# Patient Record
Sex: Female | Born: 1980 | Race: Black or African American | Hispanic: No | Marital: Single | Smoking: Never smoker
Health system: Southern US, Community
[De-identification: ages and names within clinical notes are randomized; demographics above are authoritative.]

## PROBLEM LIST (undated history)

## (undated) DIAGNOSIS — F449 Dissociative and conversion disorder, unspecified: Secondary | ICD-10-CM

## (undated) DIAGNOSIS — K219 Gastro-esophageal reflux disease without esophagitis: Secondary | ICD-10-CM

## (undated) DIAGNOSIS — E669 Obesity, unspecified: Secondary | ICD-10-CM

## (undated) DIAGNOSIS — E119 Type 2 diabetes mellitus without complications: Secondary | ICD-10-CM

## (undated) HISTORY — PX: COLONOSCOPY: SHX174

## (undated) HISTORY — DX: Type 2 diabetes mellitus without complications: E11.9

## (undated) HISTORY — DX: Obesity, unspecified: E66.9

## (undated) HISTORY — PX: OTHER SURGICAL HISTORY: SHX169

## (undated) HISTORY — DX: Gastro-esophageal reflux disease without esophagitis: K21.9

## (undated) HISTORY — PX: WISDOM TOOTH EXTRACTION: SHX21

## (undated) HISTORY — DX: Dissociative and conversion disorder, unspecified: F44.9

## (undated) HISTORY — PX: HERNIA REPAIR: SHX51

---

## 1998-05-11 ENCOUNTER — Emergency Department (HOSPITAL_COMMUNITY): Admission: EM | Admit: 1998-05-11 | Discharge: 1998-05-11 | Payer: Self-pay | Admitting: Emergency Medicine

## 1999-02-07 ENCOUNTER — Emergency Department (HOSPITAL_COMMUNITY): Admission: EM | Admit: 1999-02-07 | Discharge: 1999-02-07 | Payer: Self-pay | Admitting: Emergency Medicine

## 2000-03-25 ENCOUNTER — Emergency Department (HOSPITAL_COMMUNITY): Admission: EM | Admit: 2000-03-25 | Discharge: 2000-03-25 | Payer: Self-pay | Admitting: Emergency Medicine

## 2000-03-25 ENCOUNTER — Encounter: Payer: Self-pay | Admitting: Emergency Medicine

## 2005-06-13 ENCOUNTER — Emergency Department (HOSPITAL_COMMUNITY): Admission: EM | Admit: 2005-06-13 | Discharge: 2005-06-14 | Payer: Self-pay | Admitting: Emergency Medicine

## 2006-01-29 ENCOUNTER — Other Ambulatory Visit: Admission: RE | Admit: 2006-01-29 | Discharge: 2006-01-29 | Payer: Self-pay | Admitting: Family Medicine

## 2007-02-13 ENCOUNTER — Other Ambulatory Visit: Admission: RE | Admit: 2007-02-13 | Discharge: 2007-02-13 | Payer: Self-pay | Admitting: Family Medicine

## 2008-05-13 ENCOUNTER — Other Ambulatory Visit: Admission: RE | Admit: 2008-05-13 | Discharge: 2008-05-13 | Payer: Self-pay | Admitting: Family Medicine

## 2008-05-18 ENCOUNTER — Encounter: Admission: RE | Admit: 2008-05-18 | Discharge: 2008-05-18 | Payer: Self-pay | Admitting: Family Medicine

## 2009-06-01 ENCOUNTER — Other Ambulatory Visit: Admission: RE | Admit: 2009-06-01 | Discharge: 2009-06-01 | Payer: Self-pay | Admitting: Family Medicine

## 2010-04-10 ENCOUNTER — Encounter: Payer: Self-pay | Admitting: Family Medicine

## 2010-07-21 ENCOUNTER — Other Ambulatory Visit (HOSPITAL_COMMUNITY)
Admission: RE | Admit: 2010-07-21 | Discharge: 2010-07-21 | Disposition: A | Discharge status: Home / Self Care | Payer: BC Managed Care – PPO | Source: Ambulatory Visit | Attending: Family Medicine | Admitting: Family Medicine

## 2010-07-21 ENCOUNTER — Encounter: Payer: Self-pay | Admitting: Family Medicine

## 2010-07-21 ENCOUNTER — Ambulatory Visit (INDEPENDENT_AMBULATORY_CARE_PROVIDER_SITE_OTHER): Payer: BC Managed Care – PPO | Admitting: Family Medicine

## 2010-07-21 DIAGNOSIS — Z1159 Encounter for screening for other viral diseases: Secondary | ICD-10-CM | POA: Insufficient documentation

## 2010-07-21 DIAGNOSIS — Z0000 Encounter for general adult medical examination without abnormal findings: Secondary | ICD-10-CM

## 2010-07-21 DIAGNOSIS — E282 Polycystic ovarian syndrome: Secondary | ICD-10-CM | POA: Insufficient documentation

## 2010-07-21 DIAGNOSIS — Z01419 Encounter for gynecological examination (general) (routine) without abnormal findings: Secondary | ICD-10-CM | POA: Insufficient documentation

## 2010-07-21 DIAGNOSIS — Z136 Encounter for screening for cardiovascular disorders: Secondary | ICD-10-CM

## 2010-07-21 NOTE — Patient Instructions (Signed)
Great to meet you! 

## 2010-07-21 NOTE — Progress Notes (Signed)
30 yo here to establish care and for CPX.  G0P0, not sexually active.  Takes Metformin 1000 mg daily for ?PCOS. Per pt, not a diabetic.  Has had issues with her weight her entire life.  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 Lives with her mom who has a personality disorder and severe anxiety so at times, she gets overwhelmed. She and her mother often go to psychotherapy together which [place] has found to be helpful.    The PMH, PSH, Social History, Family History, Medications, and allergies have been reviewed in [facility], and have been updated if relevant.  ROS: General: Denies fever, chills, sweats. No significant weight loss. Eyes: Denies blurring,significant itching ENT: Denies earache, sore throat, and hoarseness.  Cardiovascular: Denies chest pains, palpitations, dyspnea on exertion,  Respiratory: Denies cough, dyspnea at rest,wheeezing Breast: no concerns about lumps GI: Denies nausea, vomiting, diarrhea, constipation, change in bowel habits, abdominal pain, melena, hematochezia GU: Denies dysuria, hematuria, urinary hesitancy, nocturia, denies STD risk, no concerns about discharge Musculoskeletal: Denies back pain, joint pain Derm: Denies rash, itching Neuro: Denies  paresthesias, frequent falls, frequent headaches Psych: Denies depression, anxiety Endocrine: Denies cold intolerance, heat intolerance, polydipsia Heme: Denies enlarged lymph nodes Allergy: No hayfever  Physical exam: BP 120/82  Pulse 80  Temp(Src) 99.3 F (37.4 C) (Oral)  Ht 5\' 5"  (1.651 m)  Wt 233 lb (105.688 kg)  BMI 38.77 kg/m2  LMP 06/19/2010  General:  Well-developed, overweight appearing,in no acute distress; alert,appropriate and cooperative throughout examination Head:  normocephalic and atraumatic.   Eyes:  vision grossly intact, pupils equal, pupils round, and pupils reactive to light.   Ears:  R ear normal and L ear normal.   Nose:  no external deformity.   Mouth:  good dentition.   Neck:  No  deformities, masses, or tenderness noted. Breasts:  No mass, nodules, thickening, tenderness, bulging, retraction, inflamation, nipple discharge or skin changes noted.   Lungs:  Normal respiratory effort, chest expands symmetrically. Lungs are clear to auscultation, no crackles or wheezes. Heart:  Normal rate and regular rhythm. S1 and S2 normal without gallop, murmur, click, rub or other extra sounds. Abdomen:  Bowel sounds positive,abdomen soft and non-tender without masses, organomegaly or hernias noted. Rectal:  no external abnormalities.   Genitalia:  Pelvic Exam:        External: normal female genitalia without lesions or masses        Vagina: normal without lesions or masses        Cervix: normal without lesions or masses        Adnexa: normal bimanual exam without masses or fullness        Uterus: normal by palpation        Pap smear: performed Msk:  No deformity or scoliosis noted of thoracic or lumbar spine.   Extremities:  No clubbing, cyanosis, edema, or deformity noted with normal full range of motion of all joints.   Neurologic:  alert & oriented X3 and gait normal.   Skin:  Intact without suspicious lesions or rashes Cervical Nodes:  No lymphadenopathy noted Axillary Nodes:  No palpable lymphadenopathy Psych:  Cognition and judgment appear intact. Alert and cooperative with normal attention span and concentration. No apparent delusions, illusions, hallucinations

## 2010-07-21 NOTE — Assessment & Plan Note (Signed)
Reviewed preventive care protocols, scheduled due services, and updated immunizations Discussed nutrition, exercise, diet, and healthy lifestyle.  Orders Placed This Encounter  Procedures  . HgB A1c  . Basic Metabolic Panel (BMET)  . Lipid panel

## 2010-07-21 NOTE — Assessment & Plan Note (Signed)
Recheck a1c today.  Awaiting records.

## 2010-07-22 LAB — BASIC METABOLIC PANEL
BUN: 8 mg/dL (ref 6–23)
CO2: 26 mEq/L (ref 19–32)
Calcium: 9 mg/dL (ref 8.4–10.5)
Chloride: 106 mEq/L (ref 96–112)
Creatinine, Ser: 0.7 mg/dL (ref 0.4–1.2)
GFR: 133.68 mL/min (ref >60.00)
Glucose, Bld: 92 mg/dL (ref 70–99)
Potassium: 3.9 mEq/L (ref 3.5–5.1)
Sodium: 139 mEq/L (ref 135–145)

## 2010-07-22 LAB — LIPID PANEL
Cholesterol: 116 mg/dL (ref 0–200)
HDL: 43.4 mg/dL (ref >39.00)
LDL Cholesterol: 62 mg/dL (ref 0–99)
Total CHOL/HDL Ratio: 3
Triglycerides: 53 mg/dL (ref 0.0–149.0)
VLDL: 10.6 mg/dL (ref 0.0–40.0)

## 2010-07-22 LAB — HEMOGLOBIN A1C: Hgb A1c MFr Bld: 6.7 % — ABNORMAL HIGH (ref 4.6–6.5)

## 2010-07-26 ENCOUNTER — Encounter: Payer: Self-pay | Admitting: *Deleted

## 2010-08-03 ENCOUNTER — Encounter: Payer: Self-pay | Admitting: Family Medicine

## 2010-08-09 ENCOUNTER — Encounter: Payer: Self-pay | Admitting: Family Medicine

## 2010-09-12 ENCOUNTER — Telehealth: Payer: Self-pay | Admitting: *Deleted

## 2010-09-12 MED ORDER — GLYBURIDE 2.5 MG PO TABS: 2.5000 mg | ORAL_TABLET | Freq: Every day | ORAL | Status: DC

## 2010-09-12 NOTE — Telephone Encounter (Signed)
Message left on cell phone voicemail advising patient as instructed. 

## 2010-09-12 NOTE — Telephone Encounter (Signed)
Patient say that she really doesn't like the metformin and that it makes her really nauseated. She is asking if she can try something else. Uses [facility].

## 2010-09-12 NOTE — Telephone Encounter (Signed)
Ok, let's try Glyburide. Rx sent to her pharmacy.

## 2010-09-15 ENCOUNTER — Ambulatory Visit (INDEPENDENT_AMBULATORY_CARE_PROVIDER_SITE_OTHER): Payer: BC Managed Care – PPO | Admitting: Family Medicine

## 2010-09-15 ENCOUNTER — Encounter: Payer: Self-pay | Admitting: Family Medicine

## 2010-09-15 VITALS — BP 110/70 | HR 102 | Temp 99.0°F | Ht 65.0 in | Wt 235.0 lb

## 2010-09-15 DIAGNOSIS — Z309 Encounter for contraceptive management, unspecified: Secondary | ICD-10-CM | POA: Insufficient documentation

## 2010-09-15 DIAGNOSIS — E669 Obesity, unspecified: Secondary | ICD-10-CM

## 2010-09-15 DIAGNOSIS — R000 Tachycardia, unspecified: Secondary | ICD-10-CM

## 2010-09-15 MED ORDER — ETONOGESTREL-ETHINYL ESTRADIOL 0.12-0.015 MG/24HR VA RING: 1.0000 | VAGINAL_RING | VAGINAL | Status: DC

## 2010-09-15 NOTE — Patient Instructions (Signed)
Great to see you. Please stop by to see Aram Beecham on your way out.

## 2010-09-15 NOTE — Progress Notes (Signed)
30 yo here to discuss birth control.    G0P0, currently sexually active with ex boyfriend. Gained weight with depo provera.  Was on Nuvaring in past and liked it. No personal or family h/o blood clots.  Pt is a non smoker.    Obesity- Has had issues with her weight her entire life. BMI is currently 39. Has tried several diets including weight watchers, low carb, even diet pills. Admits to not having self control and craves sugary drinks, like sweet tea.  Tachycardia- pulse is 102-106 on exam today. Rushed to get here, a little nervous. No CP, SOB, dizziness, or LE edema. Pulse was 80 at last OV last month.   The PMH, PSH, Social History, Family History, Medications, and allergies have been reviewed in [facility], and have been updated if relevant.  ROS: General: Denies fever, chills, sweats. No significant weight loss. Eyes: Denies blurring,significant itching ENT: Denies earache, sore throat, and hoarseness.  Cardiovascular: Denies chest pains, palpitations, dyspnea on exertion,  Respiratory: Denies cough, dyspnea at rest,wheeezing Breast: no concerns about lumps GI: Denies nausea, vomiting, diarrhea, constipation, change in bowel habits, abdominal pain, melena, hematochezia GU: Denies dysuria, hematuria, urinary hesitancy, nocturia, denies STD risk, no concerns about discharge Musculoskeletal: Denies back pain, joint pain Derm: Denies rash, itching Neuro: Denies  paresthesias, frequent falls, frequent headaches Psych: Denies depression, anxiety Endocrine: Denies cold intolerance, heat intolerance, polydipsia Heme: Denies enlarged lymph nodes Allergy: No hayfever  Physical exam: BP 110/70  Pulse 102  Temp(Src) 99 F (37.2 C) (Oral)  Ht 5\' 5"  (1.651 m)  Wt 235 lb (106.595 kg)  BMI 39.11 kg/m2  LMP 08/19/2010  General:  Well-developed, overweight appearing,in no acute distress; alert,appropriate and cooperative throughout examination Head:  normocephalic and atraumatic.     Eyes:  vision grossly intact, pupils equal, pupils round, and pupils reactive to light.   Ears:  R ear normal and L ear normal.   Nose:  no external deformity.   Mouth:  good dentition.   Neck:  No deformities, masses, or tenderness noted. Lungs:  Normal respiratory effort, chest expands symmetrically. Lungs are clear to auscultation, no crackles or wheezes. Heart:tachycardia, and regular rhythm. S1 and S2 normal without gallop, murmur, click, rub or other extra sounds. Abdomen:  Bowel sounds positive,abdomen soft and non-tender without masses, organomegaly or hernias noted. Msk:  No deformity or scoliosis noted of thoracic or lumbar spine.   Extremities:  No clubbing, cyanosis, edema, or deformity noted with normal full range of motion of all joints.   Neurologic:  alert & oriented X3 and gait normal.   Skin:  Intact without suspicious lesions or rashes Cervical Nodes:  No lymphadenopathy noted Axillary Nodes:  No palpable lymphadenopathy Psych:  Cognition and judgment appear intact. Alert and cooperative with normal attention span and concentration. No apparent delusions, illusions, hallucinations  Assessment and Plan: 1. Obesity  Deteriorated. .>35 min spent with face to face with patient, >50% counseling and/or coordinating care. Pt would like to consider bariatric surgery. Referral placed.    2. Tachycardia  New.  EKG likely normal variant with rate of 86. Was likely due to nervousness and racing to get to appt on time. Reassurance provided.  If she develops any symptoms, like palpitations, CP or SOB she will let me know.  3. Contraception management  After discussion of different options, pt would like to retry Nuvaring again. Rx given to her in office today.

## 2011-05-29 ENCOUNTER — Ambulatory Visit: Payer: BC Managed Care – PPO | Admitting: Family Medicine

## 2011-05-31 ENCOUNTER — Encounter: Payer: Self-pay | Admitting: Family Medicine

## 2011-05-31 ENCOUNTER — Ambulatory Visit (INDEPENDENT_AMBULATORY_CARE_PROVIDER_SITE_OTHER): Payer: BC Managed Care – PPO | Admitting: Family Medicine

## 2011-05-31 ENCOUNTER — Encounter: Payer: Self-pay | Admitting: Gastroenterology

## 2011-05-31 VITALS — BP 140/90 | HR 64 | Temp 98.7°F | Wt 249.0 lb

## 2011-05-31 DIAGNOSIS — R1310 Dysphagia, unspecified: Secondary | ICD-10-CM | POA: Insufficient documentation

## 2011-05-31 DIAGNOSIS — K921 Melena: Secondary | ICD-10-CM

## 2011-05-31 DIAGNOSIS — R110 Nausea: Secondary | ICD-10-CM | POA: Insufficient documentation

## 2011-05-31 DIAGNOSIS — R195 Other fecal abnormalities: Secondary | ICD-10-CM

## 2011-05-31 LAB — CBC WITH DIFFERENTIAL/PLATELET
Basophils Absolute: 0 10*3/uL (ref 0.0–0.1)
Eosinophils Absolute: 0 10*3/uL (ref 0.0–0.7)
HCT: 37.1 % (ref 36.0–46.0)
Lymphs Abs: 2.2 10*3/uL (ref 0.7–4.0)
MCHC: 32.9 g/dL (ref 30.0–36.0)
MCV: 86.5 fl (ref 78.0–100.0)
Monocytes Absolute: 0.3 10*3/uL (ref 0.1–1.0)
Platelets: 293 10*3/uL (ref 150.0–400.0)
RDW: 14.1 % (ref 11.5–14.6)

## 2011-05-31 LAB — COMPREHENSIVE METABOLIC PANEL
ALT: 25 U/L (ref 0–35)
Alkaline Phosphatase: 48 U/L (ref 39–117)
Glucose, Bld: 164 mg/dL — ABNORMAL HIGH (ref 70–99)
Sodium: 139 mEq/L (ref 135–145)
Total Bilirubin: 0 mg/dL — ABNORMAL LOW (ref 0.3–1.2)
Total Protein: 7.3 g/dL (ref 6.0–8.3)

## 2011-05-31 MED ORDER — ESOMEPRAZOLE MAGNESIUM 40 MG PO CPDR: 40.0000 mg | DELAYED_RELEASE_CAPSULE | Freq: Every day | ORAL | Status: DC

## 2011-05-31 NOTE — Progress Notes (Signed)
Subjective:    Patient ID: Wanda Mercer, female    DOB: 1980-05-28, 31 y.o.   MRN: [id number]  HPI  31 yo here for progressive symptoms of "feeling bad."  Noticed a couple of months ago that solid foods often "get stuck." At times she has to make herself vomit to feel better. No nausea.   She does endorse one episode of "black stools" and not sure if she had taken pepto or other meds prior.  Stools since have been normal. No abdominal pain.  Sensation occurs usually after eating.  Does have a h/o GERD but prilosec not helping with symptoms.  Pain not worse with certain foods.  Patient Active Problem List  Diagnoses  . PCOS (polycystic ovarian syndrome)  . Routine general medical examination at a health care facility  . Obesity  . Tachycardia  . Contraception management  . Difficulty swallowing solids  . Nausea  . Black stools   Past Medical History  Diagnosis Date  . PCOS (polycystic ovarian syndrome)    No past surgical history on file. History  Substance Use Topics  . Smoking status: Never Smoker   . Smokeless tobacco: Not on file  . Alcohol Use: Not on file   Family History  Problem Relation Age of Onset  . Depression Mother   . Mental illness Mother    Allergies  Allergen Reactions  . Penicillins Other (See Comments)    Stomach pain   Current Outpatient Prescriptions on File Prior to Visit  Medication Sig Dispense Refill  . etonogestrel-ethinyl estradiol (NUVARING) 0.12-0.015 MG/24HR vaginal ring Place 1 each vaginally every 21 ( twenty-one) days. Insert one (1) ring vaginally and leave in place for three (3) weeks, then remove for one (1) week.  1 each  11  . glyBURIDE (DIABETA) 2.5 MG tablet Take 1 tablet (2.5 mg total) by mouth daily with breakfast.  30 tablet  1  . omeprazole (PRILOSEC) 20 MG capsule Take 20 mg by mouth daily.           Review of Systems See HPI   No fevers or abdominal pain Objective:   Physical Exam BP 140/100  Pulse 64   Temp(Src) 98.7 F (37.1 C) (Oral)  Wt 249 lb (112.946 kg)  General:  Well-developed,well-nourished,in no acute distress; alert,appropriate and cooperative throughout examination Head:  normocephalic and atraumatic.   Eyes:  vision grossly intact, pupils equal, pupils round, and pupils reactive to light.   Ears:  R ear normal and L ear normal.   Nose:  no external deformity.   Mouth:  good dentition.   Neck:  No deformities, masses, or tenderness noted. Abdomen:  Bowel sounds positive,abdomen soft and non-tender without masses, organomegaly or hernias noted. Neurologic:  alert & oriented X3 and gait normal.   Skin:  Intact without suspicious lesions or rashes Psych:  Cognition and judgment appear intact. Alert and cooperative with normal attention span and concentration. No apparent delusions, illusions, hallucinations        Assessment & Plan:   1. Difficulty swallowing solids  New and progressing. ?esophageal stricture- will refer to GI for endoscopy/further work up. Differential also includes PUD and biliary disease although less likely given history. Ambulatory referral to Gastroenterology  2. Black stools  New and resolved. Most likely secondary to medications she took prior.  Will check CBC, CMET today and place on 2 weeks of daily nexium (samples given). The patient indicates understanding of these issues and agrees with the plan.  CBC with  Differential, Comprehensive metabolic panel [[facility]], Ambulatory referral to Gastroenterology

## 2011-05-31 NOTE — Patient Instructions (Signed)
Good to see you. Please stop by to see Wanda Mercer on your way out. Take one nexium tablet every day for next two weeks. I will call you with your lab results.

## 2011-06-04 ENCOUNTER — Other Ambulatory Visit: Payer: Self-pay | Admitting: Family Medicine

## 2011-06-06 ENCOUNTER — Encounter: Payer: Self-pay | Admitting: Gastroenterology

## 2011-06-06 ENCOUNTER — Ambulatory Visit (INDEPENDENT_AMBULATORY_CARE_PROVIDER_SITE_OTHER): Payer: BC Managed Care – PPO | Admitting: Gastroenterology

## 2011-06-06 DIAGNOSIS — R110 Nausea: Secondary | ICD-10-CM

## 2011-06-06 DIAGNOSIS — K219 Gastro-esophageal reflux disease without esophagitis: Secondary | ICD-10-CM

## 2011-06-06 DIAGNOSIS — R109 Unspecified abdominal pain: Secondary | ICD-10-CM

## 2011-06-06 DIAGNOSIS — R195 Other fecal abnormalities: Secondary | ICD-10-CM

## 2011-06-06 DIAGNOSIS — R1310 Dysphagia, unspecified: Secondary | ICD-10-CM

## 2011-06-06 DIAGNOSIS — K921 Melena: Secondary | ICD-10-CM

## 2011-06-06 MED ORDER — DEXLANSOPRAZOLE 60 MG PO CPDR: 60.0000 mg | DELAYED_RELEASE_CAPSULE | Freq: Every day | ORAL | Status: AC

## 2011-06-06 NOTE — Progress Notes (Signed)
History of Present Illness:  This is a elderly obese 31 year old African American female who presents with years of rather typical as the reflux symptom with burning substernal chest pain daytime and nighttime. For the last 2 months she's had intermittent solid food dysphagia. She had a negative upper GI series one year ago. She denies a specific hepatobiliary complaints, but does complain of abdominal gas, bloating, excessive flatus. She does not have known lactose intolerance. She denies bowel or regularity, melena, or hematochezia. Her mother apparently has similar reflux problems. Lab review shows normal CBC and normal liver enzymes except for slightly elevated alkaline phosphatase, and a hemoglobin A1c of 6.7.  I have reviewed this patient's present history, medical and surgical past history, allergies and medications.     ROS: The remainder of the 10 point ROS is negative... symptoms of Raynaud's phenomenon for collagen vascular disease. She has no history of previous hepatitis or pancreatitis, abuse of alcohol, cigarettes, or NSAIDs.     Physical Exam: Today. Patient in no acute distress. Blood pressure 142/86, pulse 100 and regular and BMI of 41.77. General well developed well nourished patient in no acute distress, appearing her stated age Eyes PERRLA, no icterus, fundoscopic exam per opthamologist Skin no lesions noted Neck supple, no adenopathy, no thyroid enlargement, no tenderness Chest clear to percussion and auscultation Heart no significant murmurs, gallops or rubs noted Abdomen no hepatosplenomegaly masses or tenderness, BS normal.  Extremities no acute joint lesions, edema, phlebitis or evidence of cellulitis. Neurologic patient oriented x 3, cranial nerves intact, no focal neurologic deficits noted. Psychological mental status normal and normal affect.  Assessment and plan: chroni GERD with possible peptic stricture of the esophagus. Reflux regime reviewed with the patient  and she was shown an education video on GERD. Her scheduled endoscopy with possible dilatation at her convenience, and I placed her on regular Dexilant 60 mg 30 minutes before first meal the day. This patient should possibly consider bariatric surgery. Have also schedule upper GI ultrasound exam to exclude cholelithiasis or fatty liver problems.  No diagnosis found.

## 2011-06-06 NOTE — Patient Instructions (Signed)
You have been scheduled for an abdominal ultrasound.  [facility] on 06/08/11 please arrive at 10:45 am and have nothing to eat or drink after 5 am. You have been scheduled for an Endoscopy, instructions provided. Dexilant samples have been given to you, take 1 daily 20-30 minutes before breakfast. You have seen the reflux movie.

## 2011-06-08 ENCOUNTER — Other Ambulatory Visit (HOSPITAL_COMMUNITY): Payer: BC Managed Care – PPO

## 2011-06-14 ENCOUNTER — Ambulatory Visit (AMBULATORY_SURGERY_CENTER): Payer: BC Managed Care – PPO | Admitting: Gastroenterology

## 2011-06-14 ENCOUNTER — Encounter (HOSPITAL_COMMUNITY): Payer: Self-pay | Admitting: Emergency Medicine

## 2011-06-14 ENCOUNTER — Emergency Department (HOSPITAL_COMMUNITY)
Admission: EM | Admit: 2011-06-14 | Discharge: 2011-06-15 | Disposition: A | Discharge status: Home / Self Care | Payer: BC Managed Care – PPO | Attending: Emergency Medicine | Admitting: Emergency Medicine

## 2011-06-14 ENCOUNTER — Encounter: Payer: Self-pay | Admitting: Gastroenterology

## 2011-06-14 ENCOUNTER — Emergency Department (HOSPITAL_COMMUNITY): Payer: BC Managed Care – PPO

## 2011-06-14 ENCOUNTER — Telehealth: Payer: Self-pay | Admitting: Gastroenterology

## 2011-06-14 VITALS — BP 145/93 | HR 105 | Temp 98.6°F | Resp 16 | Ht 65.0 in | Wt 251.0 lb

## 2011-06-14 DIAGNOSIS — R51 Headache: Secondary | ICD-10-CM | POA: Insufficient documentation

## 2011-06-14 DIAGNOSIS — R1310 Dysphagia, unspecified: Secondary | ICD-10-CM

## 2011-06-14 DIAGNOSIS — R4182 Altered mental status, unspecified: Secondary | ICD-10-CM | POA: Insufficient documentation

## 2011-06-14 DIAGNOSIS — T7840XA Allergy, unspecified, initial encounter: Secondary | ICD-10-CM | POA: Insufficient documentation

## 2011-06-14 DIAGNOSIS — X58XXXA Exposure to other specified factors, initial encounter: Secondary | ICD-10-CM | POA: Insufficient documentation

## 2011-06-14 DIAGNOSIS — K219 Gastro-esophageal reflux disease without esophagitis: Secondary | ICD-10-CM

## 2011-06-14 MED ORDER — SODIUM CHLORIDE 0.9 % IV SOLN: 500.0000 mL | INTRAVENOUS | Status: DC

## 2011-06-14 MED ORDER — LORAZEPAM 2 MG/ML IJ SOLN
1.0000 mg | Freq: Once | INTRAMUSCULAR | Status: AC
  Administered 2011-06-15: 1 mg via INTRAVENOUS
  Filled 2011-06-14: qty 1

## 2011-06-14 MED ORDER — LORAZEPAM 1 MG PO TABS
2.0000 mg | ORAL_TABLET | Freq: Once | ORAL | Status: AC
  Administered 2011-06-14: 2 mg via ORAL
  Filled 2011-06-14: qty 1

## 2011-06-14 NOTE — ED Provider Notes (Signed)
History     CSN: [id number]  Arrival date & time 06/14/11  7829   First MD Initiated Contact with Patient 06/14/11 1953      Chief Complaint  Patient presents with  . Allergic Reaction     HPI Pt presenting to ed with c/o just left [place] for colonoscopy and received propofol and per mother on the way home pt attempted to jump out of car and pt is not acting her normal self. Per mother pt is not pt is talking like a baby and not sounding like herself. Pt denies si/hi. Pt is alert and oriented at this time. Pt tearful stating what did they do to me.  Past Medical History  Diagnosis Date  . GERD (gastroesophageal reflux disease)   . Obesity     Past Surgical History  Procedure Date  . Oral surgeries   . Hernia repair   . Colonoscopy     Family History  Problem Relation Age of Onset  . Ovarian cancer Maternal Aunt   . Diabetes Maternal Grandmother   . Diabetes Father   . Heart failure Father   . Emphysema Paternal Grandmother   . Diabetes Paternal Grandmother     History  Substance Use Topics  . Smoking status: Never Smoker   . Smokeless tobacco: Never Used  . Alcohol Use: Yes     occasionally    OB History    Grav Para Term Preterm Abortions TAB SAB Ect Mult Living                  Review of Systems  Unable to perform ROS: Mental status change    Allergies  Penicillins and Propofol  Home Medications   Current Outpatient Rx  Name Route Sig Dispense Refill  . DEXLANSOPRAZOLE 60 MG PO CPDR Oral Take 1 capsule (60 mg total) by mouth daily. 15 capsule 0  . ETONOGESTREL-ETHINYL ESTRADIOL 0.12-0.015 MG/24HR VA RING Vaginal Place 1 each vaginally every 21 ( twenty-one) days. Insert one (1) ring vaginally and leave in place for three (3) weeks, then remove for one (1) week.      BP 119/50  Pulse 86  Temp(Src) 99.1 F (37.3 C) (Oral)  Resp 18  Ht 5\' 5"  (1.651 m)  Wt 250 lb (113.399 kg)  BMI 41.60 kg/m2  SpO2 98%  LMP 05/08/2011  Physical Exam    Nursing note and vitals reviewed. Constitutional: She is oriented to person, place, and time. She appears well-developed and well-nourished. No distress.  HENT:  Head: Normocephalic and atraumatic.  Eyes: Pupils are equal, round, and reactive to light.  Neck: Normal range of motion.  Cardiovascular: Intact distal pulses.  Tachycardia present.   Pulmonary/Chest: No respiratory distress.  Abdominal: Normal appearance. She exhibits no distension.  Musculoskeletal: Normal range of motion.  Neurological: She is alert and oriented to person, place, and time. No cranial nerve deficit.  Skin: Skin is warm and dry. No rash noted.  Psychiatric: Her mood appears anxious. Her speech is rapid and/or pressured. She is agitated and actively hallucinating. Thought content is paranoid. Cognition and memory are impaired. She does not express inappropriate judgment. She expresses no suicidal plans and no homicidal plans.    ED Course  Procedures (including critical care time)  Labs Reviewed  CBC - Abnormal; Notable for the following:    Hemoglobin 11.6 (*)    HCT 35.4 (*)    All other components within normal limits  BASIC METABOLIC PANEL - Abnormal; Notable for  the following:    CO2 18 (*)    Glucose, Bld 122 (*)    All other components within normal limits  SALICYLATE LEVEL - Abnormal; Notable for the following:    Salicylate Lvl <2.0 (*)    All other components within normal limits  DIFFERENTIAL  URINE RAPID DRUG SCREEN (HOSP PERFORMED)  ETHANOL  ACETAMINOPHEN LEVEL  POCT PREGNANCY, URINE  LAB REPORT - SCANNED   No results found.   1. Allergic reaction       MDM  Patient was reevaluated after approximately 3 hours and still seems inappropriate talking like a young child very labile behavior but no focal neurologic findings plan at this time status today her do a CT scan and reevaluate at that time.       Nelia Shi, MD 06/18/11 [phone]

## 2011-06-14 NOTE — ED Notes (Signed)
Pt is very confused and is talking baby talk.  She would not take the ativan and it took coxing and placing it in applesauce and she swolled it she was offered some food but would not eat.

## 2011-06-14 NOTE — Op Note (Signed)
[facility] [address] [place], [place]  [address]  ENDOSCOPY PROCEDURE REPORT  PATIENT:  Wanda Mercer, Wanda Mercer  MR#:  [id number] BIRTHDATE:  21-Feb-1981, 30 yrs. old  GENDER:  female  ENDOSCOPIST:  Vania Rea. Jarold Motto, MD, [facility], [place] Referred by:  PROCEDURE DATE:  06/14/2011 PROCEDURE:  Elease Hashimoto Dilation of Esophagus, EGD, diagnostic 43235 ASA CLASS:  Class III INDICATIONS:  chest pain, dysphagia NORMAL BARIUM SWALLOW0  MEDICATIONS:   propofol (Diprivan) 200 mg IV TOPICAL ANESTHETIC:  DESCRIPTION OF PROCEDURE:   After the risks and benefits of the procedure were explained, informed consent was obtained.  The LB GIF-H180 [id number] endoscope was introduced through the mouth and advanced to the second portion of the duodenum.  The instrument was slowly withdrawn as the mucosa was fully examined. <<PROCEDUREIMAGES>>  A hiatal hernia was found. 3 CM HH NOTED.SEE PICTURES.  Normal GE junction was noted.  Normal duodenal folds were noted.  The stomach was entered and closely examined. The antrum, angularis, and lesser curvature were well visualized, including a retroflexed view of the cardia and fundus. The stomach wall was normally distensable. The scope passed easily through the pylorus into the duodenum.  The esophagus and gastroesophageal junction were completely normal in appearance. DILATED #73F MALONEY DILATOR.NO HEME OR PAIN.    Retroflexed views revealed a hiatal hernia. The scope was then withdrawn from the patient and the procedure completed.  COMPLICATIONS:  None  ENDOSCOPIC IMPRESSION: 1) Hiatal hernia 2) Normal GE junction 3) Normal duodenal folds 4) Normal stomach 5) Normal esophagus 6) A hiatal hernia PROBABLE GERD AND OCCULT STRICTURE DILATED RECOMMENDATIONS: 1) Anti-reflux regimen to be follow CONTINUE PPI RX.ULTRASOUND PENDING.  ______________________________ Vania Rea. Jarold Motto, MD, [facility]  CC:  Ruthe Mannan MD  n. Rosalie DoctorVania Rea. Ishaq Maffei at  06/14/2011 04:40 PM  Wendall Mola, [phone]

## 2011-06-14 NOTE — Telephone Encounter (Signed)
Answering service message stating that mother called b/c daughter, who had Egd earlier in day, was acting funny.  Called twice - no answer.  Left message on the second call.

## 2011-06-14 NOTE — Patient Instructions (Signed)
Discharge instructions given with verbal understanding. Handout on a dilatation diet given. Resume previous medications. YOU HAD AN ENDOSCOPIC PROCEDURE TODAY AT THE [facility]: Refer to the procedure report that was given to you for any specific questions about what was found during the examination.  If the procedure report does not answer your questions, please call your gastroenterologist to clarify.  If you requested that your care partner not be given the details of your procedure findings, then the procedure report has been included in a sealed envelope for you to review at your convenience later.  YOU SHOULD EXPECT: Some feelings of bloating in the abdomen. Passage of more gas than usual.  Walking can help get rid of the air that was put into your GI tract during the procedure and reduce the bloating. If you had a lower endoscopy (such as a colonoscopy or flexible sigmoidoscopy) you may notice spotting of blood in your stool or on the toilet paper. If you underwent a bowel prep for your procedure, then you may not have a normal bowel movement for a few days.  DIET: Your first meal following the procedure should be a light meal and then it is ok to progress to your normal diet.  A half-sandwich or bowl of soup is an example of a good first meal.  Heavy or fried foods are harder to digest and may make you feel nauseous or bloated.  Likewise meals heavy in dairy and vegetables can cause extra gas to form and this can also increase the bloating.  Drink plenty of fluids but you should avoid alcoholic beverages for 24 hours.  ACTIVITY: Your care partner should take you home directly after the procedure.  You should plan to take it easy, moving slowly for the rest of the day.  You can resume normal activity the day after the procedure however you should NOT DRIVE or use heavy machinery for 24 hours (because of the sedation medicines used during the test).    SYMPTOMS TO REPORT  IMMEDIATELY: A gastroenterologist can be reached at any hour.  During normal business hours, 8:30 AM to 5:00 PM Monday through Friday, call [phone].  After hours and on weekends, please call the GI answering service at [phone] who will take a message and have the physician on call contact you.   Following upper endoscopy (EGD)  Vomiting of blood or coffee ground material  New chest pain or pain under the shoulder blades  Painful or persistently difficult swallowing  New shortness of breath  Fever of 100F or higher  Black, tarry-looking stools  FOLLOW UP: If any biopsies were taken you will be contacted by phone or by letter within the next 1-3 weeks.  Call your gastroenterologist if you have not heard about the biopsies in 3 weeks.  Our staff will call the home number listed on your records the next business day following your procedure to check on you and address any questions or concerns that you may have at that time regarding the information given to you following your procedure. This is a courtesy call and so if there is no answer at the home number and we have not heard from you through the emergency physician on call, we will assume that you have returned to your regular daily activities without incident.  SIGNATURES/CONFIDENTIALITY: You and/or your care partner have signed paperwork which will be entered into your electronic medical record.  These signatures attest to the fact that that the information   above on your After Visit Summary has been reviewed and is understood.  Full responsibility of the confidentiality of this discharge information lies with you and/or your care-partner. 

## 2011-06-14 NOTE — ED Notes (Signed)
Pt presenting to ed with c/o just left [place] for colonoscopy and received propofol and per mother on the way home pt attempted to jump out of car and pt is not acting her normal self. Per mother pt is not pt is talking like a baby and not sounding like herself. Pt denies si/hi. Pt is alert and oriented at this time. Pt tearful stating what did they do to me.

## 2011-06-14 NOTE — Progress Notes (Signed)
Patient did not experience any of the following events: a burn prior to discharge; a fall within the facility; wrong site/side/patient/procedure/implant event; or a hospital transfer or hospital admission upon discharge from the facility. (G8907) Patient did not have preoperative order for IV antibiotic SSI prophylaxis. ([id number])  

## 2011-06-15 ENCOUNTER — Telehealth: Payer: Self-pay | Admitting: Gastroenterology

## 2011-06-15 ENCOUNTER — Telehealth: Payer: Self-pay | Admitting: *Deleted

## 2011-06-15 LAB — BASIC METABOLIC PANEL
BUN: 7 mg/dL (ref 6–23)
Chloride: 105 mEq/L (ref 96–112)
GFR calc Af Amer: >90 mL/min (ref >90)
Glucose, Bld: 122 mg/dL — ABNORMAL HIGH (ref 70–99)
Potassium: 3.8 mEq/L (ref 3.5–5.1)
Sodium: 137 mEq/L (ref 135–145)

## 2011-06-15 LAB — DIFFERENTIAL
Basophils Relative: 1 % (ref 0–1)
Eosinophils Relative: 0 % (ref 0–5)
Lymphs Abs: 2.1 10*3/uL (ref 0.7–4.0)
Monocytes Absolute: 0.4 10*3/uL (ref 0.1–1.0)
Neutro Abs: 5.7 10*3/uL (ref 1.7–7.7)
Neutrophils Relative %: 69 % (ref 43–77)

## 2011-06-15 LAB — ETHANOL: Alcohol, Ethyl (B): <11 mg/dL (ref 0–11)

## 2011-06-15 LAB — RAPID URINE DRUG SCREEN, HOSP PERFORMED
Amphetamines: NOT DETECTED
Barbiturates: NOT DETECTED
Benzodiazepines: NOT DETECTED
Cocaine: NOT DETECTED

## 2011-06-15 LAB — POCT PREGNANCY, URINE: Preg Test, Ur: NEGATIVE

## 2011-06-15 LAB — CBC
HCT: 35.4 % — ABNORMAL LOW (ref 36.0–46.0)
Hemoglobin: 11.6 g/dL — ABNORMAL LOW (ref 12.0–15.0)
MCH: 28.2 pg (ref 26.0–34.0)
MCHC: 32.8 g/dL (ref 30.0–36.0)
MCV: 86.1 fL (ref 78.0–100.0)
RBC: 4.11 MIL/uL (ref 3.87–5.11)

## 2011-06-15 NOTE — Telephone Encounter (Signed)
No answer, message left for the patient. 

## 2011-06-15 NOTE — ED Notes (Signed)
Pt up in bed eating talking more appropriately.

## 2011-06-15 NOTE — ED Notes (Signed)
Report received from Marla, RN-airway intact-no s/s's of distress

## 2011-06-15 NOTE — Telephone Encounter (Signed)
RECEIVED A TELEPHONE NOTE TO CALL THIS PATIENT IN ROOM #21. CALLED [place], CONNECTED TO ED, TRANSFERRED TO PATIENT ROOM, SPOKE WITH MOTHER. MOTHER STATING THEY ARE VERY UPSET. STATES THAT AFTER LEAVING [place]  YESTERDAY, PATIENT STARTED ACTING STRANGE BY THE TIME THEY REACHED THE STOPLIGHT. PATIENT OPENING THE DOOR TO THE CAR AS THE CAR WAS MOVING. MOTHER DROVE TO A [employer] [address] [place]AR [place] AND [address] AND STOPPED THE CAR.MOTHER STATING THE PATIENT AT THAT TIME WAS SPEAKING LIKE A BABY. AT THAT TIME SHE CALLED THE EMERGENCY NUMBER, MESSAGE LEFT WITH ANSWERING SERVICE. MOTHER STATING SHE DID NOT RECEIVE A REPLY AND THEN DROVE BACK TO [place], GUARD ESCORTING HER TO THE 4TH AND 3RD FLOOR, NO STAFF PRESENT AT THAT TIME.MOTHER STATING PATIENT FELL OUT ON THE FLOOR. GUARD HELPING HER INTO WC AND INSTRUCTING FOR PATIENT TO GO TO ED. PATIENT HAS BEEN IN ED SINCE THAT TIME. MOTHER STATING SHE WAS UPSET ABOUT THE INCIDENT. STATES THAT PATIENT SAID THAT THE ANESTHETIST SAID HER BMI WAS AT 50 % AND IT WOULD BE DIFFICULT TO SEDATE HER. MOTHER THEN QUESTIONING WHETHER ANOTHER DRUG SHOULD HAVE BEEN GIVEN. PATIENT IS FOR DISCHARGE FROM ED, ENCOURAGED MOTHER TO ADD PROPOFOL TO ALLERGY LIST IF ED PHYSICIAN FEELS THAT THIS IS AN ALLERGY OR INTOLERANCE.

## 2011-06-15 NOTE — BH Assessment (Signed)
Assessment Note   Wanda Mercer is a 31 y.o. female who presents to [facility] after reportedly having a reaction to Propofol. Pt's mother, Purity Irmen ([phone]), is at bedside. Mother reports pt went in for an endoscopy earlier 3/27. Mother states pt was given propofol for the procedure. Mother states since that time pt has been acting peculiar. She states pt opened the passenger side car door on their way home and began talking in a "baby voice." Mother reports she attempted to call the Dr who preformed the procedure but has been unable to get in contact with them. Pt was then brought to the ED. Mom further reports pt has been stating "I'm not myself" since the procedure. Mother reports pt has no prior psychiatric history and is not on any psychiatric medications. Mother reports family history of mental health concerns including schizophrenia and bipolar disorder. Mother also reports pt has a "low tolerance to pain."    Pt denies any SI, HI, AHVH, and SA. Pt reports her brian feels "fuzzy." She reports she opened the car door to show her mother that something was wrong. She reports she felt like she could not control herself at that time. Pt reports no prior psychiatric history. When asked about mental health concerns, pt began to cry and stated "I'm not always like this, I have a masters degree." Pt then began to ask for her mother using a child like voice. Mother was brought back in to the room. Pt then looked at her mother and repeated several times "mommy tell them I'm not like this." Mother then stated, "She's not usually like this." Pt continued to talk in a child like voice to her mother and would not make further eye contact with this Clinical research associate.   A tele-psych was completed and recommends pt remain in the ED over night for further evaluation and be reassessed in the morning.       Axis I: Substance Induced Mood Disorder Axis II: Deferred Axis III:  Past Medical History  Diagnosis Date  . GERD  (gastroesophageal reflux disease)   . Obesity    Axis IV: other psychosocial or environmental problems Axis V: 31-40 impairment in reality testing  Past Medical History:  Past Medical History  Diagnosis Date  . GERD (gastroesophageal reflux disease)   . Obesity     Past Surgical History  Procedure Date  . Oral surgeries   . Hernia repair   . Colonoscopy     Family History:  Family History  Problem Relation Age of Onset  . Ovarian cancer Maternal Aunt   . Diabetes Maternal Grandmother   . Diabetes Father   . Heart failure Father   . Emphysema Paternal Grandmother   . Diabetes Paternal Grandmother     Social History:  reports that she has never smoked. She has never used smokeless tobacco. She reports that she drinks alcohol. She reports that she does not use illicit drugs.  Additional Social History:  Alcohol / Drug Use History of alcohol / drug use?: No history of alcohol / drug abuse Allergies:  Allergies  Allergen Reactions  . Penicillins Other (See Comments)    Stomach pain  . Propofol Other (See Comments)    "can't talk right."    Home Medications:  Medications Prior to Admission  Medication Dose Route Frequency Provider Last Rate Last Dose  . LORazepam (ATIVAN) injection 1 mg  1 mg Intravenous Once Nelia Shi, MD   1 mg at 06/15/11 0053  .  LORazepam (ATIVAN) tablet 2 mg  2 mg Oral Once Nelia Shi, MD   2 mg at 06/14/11 2114  . DISCONTD: 0.9 %  sodium chloride infusion  500 mL Intravenous Continuous Mardella Layman, MD       Medications Prior to Admission  Medication Sig Dispense Refill  . dexlansoprazole (DEXILANT) 60 MG capsule Take 1 capsule (60 mg total) by mouth daily.  15 capsule  0  . etonogestrel-ethinyl estradiol (NUVARING) 0.12-0.015 MG/24HR vaginal ring Place 1 each vaginally every 21 ( twenty-one) days. Insert one (1) ring vaginally and leave in place for three (3) weeks, then remove for one (1) week.        OB/GYN Status:   Patient's last menstrual period was 05/08/2011.  General Assessment Data Location of Assessment: [facility] ED Living Arrangements: Alone Can pt return to current living arrangement?: Yes Admission Status: Voluntary Is patient capable of signing voluntary admission?: Yes Transfer from: Acute Hospital Referral Source: MD  Education Status Is patient currently in school?: No Highest grade of school patient has completed: masters degreee  Risk to self Suicidal Ideation: No Suicidal Intent: No Is patient at risk for suicide?: No Suicidal Plan?: No Access to Means: No What has been your use of drugs/alcohol within the last 12 months?: none Previous Attempts/Gestures: No How many times?: 0  Other Self Harm Risks: none Triggers for Past Attempts: None known Intentional Self Injurious Behavior: None Family Suicide History: No Recent stressful life event(s):  (medical procedure) Persecutory voices/beliefs?: No Depression: No Depression Symptoms:  (none noted) Substance abuse history and/or treatment for substance abuse?: No Suicide prevention information given to non-admitted patients: Not applicable  Risk to Others Homicidal Ideation: No Thoughts of Harm to Others: No Current Homicidal Intent: No Current Homicidal Plan: No Access to Homicidal Means: No Identified Victim: none History of harm to others?: No Assessment of Violence: None Noted Violent Behavior Description: none Does patient have access to weapons?: No Criminal Charges Pending?: No Does patient have a court date: No  Psychosis Hallucinations: None noted Delusions: None noted  Mental Status Report Appear/Hygiene: Disheveled Eye Contact: Fair Motor Activity: Unremarkable Speech: Logical/coherent (talked in high pitched child like voice) Level of Consciousness: Drowsy Mood: Anxious;Helpless Affect: Anxious Anxiety Level: Moderate Thought Processes: Relevant Judgement: Impaired Orientation:  Person;Place;Situation Obsessive Compulsive Thoughts/Behaviors: None  Cognitive Functioning Concentration: Normal Memory: Recent Intact;Remote Intact IQ: Average Insight: Fair Impulse Control: Fair Appetite: Good Weight Loss: 0  Weight Gain: 0  Sleep: No Change Vegetative Symptoms: None  Prior Inpatient Therapy Prior Inpatient Therapy: No Prior Therapy Dates: n/a Prior Therapy Facilty/Provider(s): n/a Reason for Treatment: n/a  Prior Outpatient Therapy Prior Outpatient Therapy: No Prior Therapy Dates: n/a Prior Therapy Facilty/Provider(s): n/a Reason for Treatment: n/a  ADL Screening (condition at time of admission) Patient's cognitive ability adequate to safely complete daily activities?: Yes Patient able to express need for assistance with ADLs?: Yes Independently performs ADLs?: Yes  Home Assistive Devices/Equipment Home Assistive Devices/Equipment: None    Abuse/Neglect Assessment (Assessment to be complete while patient is alone) Physical Abuse: Denies Verbal Abuse: Denies Sexual Abuse: Denies Exploitation of patient/patient's resources: Denies Self-Neglect: Denies     Merchant navy officer (For Healthcare) Advance Directive: Patient does not have advance directive Nutrition Screen Diet: Regular Unintentional weight loss greater than 10lbs within the last month: No Home Tube Feeding or Total Parenteral Nutrition (TPN): No Patient appears severely malnourished: No Pregnant or Lactating: No  Additional Information 1:1 In Past 12 Months?: No CIRT Risk:  No Elopement Risk: No Does patient have medical clearance?: No     Disposition:  Disposition Disposition of Patient: Other dispositions (tele-psych recommend holding pt over-night and then re-asses)  On Site Evaluation by:   Reviewed with Physician:     Marjean Donna 06/15/2011 4:31 AM

## 2011-06-15 NOTE — ED Provider Notes (Signed)
History     CSN: [id number]  Arrival date & time 06/14/11  4098   First MD Initiated Contact with Patient 06/14/11 1953      Chief Complaint  Patient presents with  . Allergic Reaction    (Consider location/radiation/quality/duration/timing/severity/associated sxs/prior treatment) HPI  Past Medical History  Diagnosis Date  . GERD (gastroesophageal reflux disease)   . Obesity     Past Surgical History  Procedure Date  . Oral surgeries   . Hernia repair   . Colonoscopy     Family History  Problem Relation Age of Onset  . Ovarian cancer Maternal Aunt   . Diabetes Maternal Grandmother   . Diabetes Father   . Heart failure Father   . Emphysema Paternal Grandmother   . Diabetes Paternal Grandmother     History  Substance Use Topics  . Smoking status: Never Smoker   . Smokeless tobacco: Never Used  . Alcohol Use: Yes     occasionally    OB History    Grav Para Term Preterm Abortions TAB SAB Ect Mult Living                  Review of Systems  Allergies  Penicillins and Propofol  Home Medications   Current Outpatient Rx  Name Route Sig Dispense Refill  . DEXLANSOPRAZOLE 60 MG PO CPDR Oral Take 1 capsule (60 mg total) by mouth daily. 15 capsule 0  . ETONOGESTREL-ETHINYL ESTRADIOL 0.12-0.015 MG/24HR VA RING Vaginal Place 1 each vaginally every 21 ( twenty-one) days. Insert one (1) ring vaginally and leave in place for three (3) weeks, then remove for one (1) week.      BP 119/50  Pulse 86  Temp(Src) 99.1 F (37.3 C) (Oral)  Resp 18  Ht 5\' 5"  (1.651 m)  Wt 250 lb (113.399 kg)  BMI 41.60 kg/m2  SpO2 98%  LMP 05/08/2011  Physical Exam  ED Course  Procedures (including critical care time)  Labs Reviewed  CBC - Abnormal; Notable for the following:    Hemoglobin 11.6 (*)    HCT 35.4 (*)    All other components within normal limits  BASIC METABOLIC PANEL - Abnormal; Notable for the following:    CO2 18 (*)    Glucose, Bld 122 (*)    All other  components within normal limits  SALICYLATE LEVEL - Abnormal; Notable for the following:    Salicylate Lvl <2.0 (*)    All other components within normal limits  DIFFERENTIAL  URINE RAPID DRUG SCREEN (HOSP PERFORMED)  ETHANOL  ACETAMINOPHEN LEVEL  POCT PREGNANCY, URINE   Ct Head Wo Contrast  06/15/2011  *RADIOLOGY REPORT*  Clinical Data: 31 year old female with altered mental status and headache.  CT HEAD WITHOUT CONTRAST  Technique:  Contiguous axial images were obtained from the base of the skull through the vertex without contrast.  Comparison: None  Findings: No intracranial abnormalities are identified, including mass lesion or mass effect, hydrocephalus, extra-axial fluid collection, midline shift, hemorrhage, or acute infarction.  The visualized bony calvarium is unremarkable.  IMPRESSION: No evidence of intracranial abnormality.  Original Report Authenticated By: Rosendo Gros, M.D.     1. Allergic reaction       MDM  Recheck at 2015:  Alert and oriented x3. No neuro deficits. Normal behavior. We'll discharge home. Discussed test findings.        Donnetta Hutching, MD 06/15/11 [phone]

## 2011-06-15 NOTE — Telephone Encounter (Signed)
This patient has many functional problems, this sounds like a hysterical reaction. This is not a reaction to propofol. She did perfectly fine with her endoscopic procedure. I will send a letter to her primary care physician that she needs psychiatric evaluation.

## 2011-06-15 NOTE — Discharge Instructions (Signed)
You're slightly anemic. Otherwise tests were normal. Recommend taking multivitamin with iron daily.

## 2011-06-16 ENCOUNTER — Ambulatory Visit (HOSPITAL_COMMUNITY)
Admission: RE | Admit: 2011-06-16 | Discharge: 2011-06-16 | Disposition: A | Discharge status: Home / Self Care | Payer: BC Managed Care – PPO | Source: Ambulatory Visit | Attending: Gastroenterology | Admitting: Gastroenterology

## 2011-06-16 DIAGNOSIS — K921 Melena: Secondary | ICD-10-CM

## 2011-06-16 DIAGNOSIS — R110 Nausea: Secondary | ICD-10-CM

## 2011-06-19 NOTE — Telephone Encounter (Signed)
See other note from 06/15/2011

## 2011-06-21 ENCOUNTER — Ambulatory Visit (HOSPITAL_COMMUNITY)
Admission: RE | Admit: 2011-06-21 | Discharge: 2011-06-21 | Disposition: A | Discharge status: Home / Self Care | Payer: BC Managed Care – PPO | Source: Ambulatory Visit | Attending: Gastroenterology | Admitting: Gastroenterology

## 2011-06-21 DIAGNOSIS — R109 Unspecified abdominal pain: Secondary | ICD-10-CM | POA: Insufficient documentation

## 2011-08-08 ENCOUNTER — Telehealth: Payer: Self-pay

## 2011-08-08 NOTE — Telephone Encounter (Signed)
Received message that pt had called this morning. Returned phone call and left message for pt to call back.

## 2011-08-09 NOTE — Telephone Encounter (Signed)
Call place in error.

## 2012-10-31 ENCOUNTER — Encounter: Payer: BC Managed Care – PPO | Attending: Family Medicine | Admitting: *Deleted

## 2012-10-31 ENCOUNTER — Encounter: Payer: Self-pay | Admitting: *Deleted

## 2012-10-31 VITALS — Ht 65.0 in | Wt 246.6 lb

## 2012-10-31 DIAGNOSIS — E669 Obesity, unspecified: Secondary | ICD-10-CM | POA: Insufficient documentation

## 2012-10-31 DIAGNOSIS — Z713 Dietary counseling and surveillance: Secondary | ICD-10-CM | POA: Insufficient documentation

## 2012-10-31 DIAGNOSIS — E119 Type 2 diabetes mellitus without complications: Secondary | ICD-10-CM

## 2012-10-31 NOTE — Progress Notes (Signed)
Medical Nutrition Therapy:  Appt start time: 1445 end time:  1545.  Assessment:  Primary concern today: Type 2 diabetes and obesity. Patient with diagnosis of type 2 diabetes a year ago reports that she is still somewhat in denial about having it. She does not check her blood glucose. She has also been trying to lose weight this last year with slow, but steady weight loss of about 14 pounds. She is a Runner, broadcasting/film/video and gets up early, so she does not like having breakfast, but does try to eat something small. She is not a big fruit/vegetable person. She admits to stress snacking, craves sugar, and frequently drinks sweetened drinks. She is trying to set small goals for herself, and celebrates every 10 pound weight loss.   MEDICATIONS: Januvia, lisinopril   DIETARY INTAKE:   Usual eating pattern includes 3 meals and 2 snacks per day.  24-hr recall:  B ( AM): Juice, biscuit, sausage patty, cheese  Snk ( AM): None al K protein shake Snk ( PM): Candy bars at [place] L ( PM): Leftovers, OR [employer] D ( PM): Spaghetti OR chicken, mashed potatoes/fries Snk ( PM): Chips (Cheetos, flavored popcorn) Beverages: Soda, flavored sparkling water, sweet tea  Usual physical activity: Walking dog, most days 20-30 minutes, has treadmill  Estimated energy needs: 1800 calories 203 g carbohydrates 113 g protein 60 g fat  Progress Towards Goal(s):  In progress.   Nutritional Diagnosis:  NB-1.1 Food and nutrition-related knowledge deficit As related to diabetes and weight loss.  As evidenced by no prior education.    Intervention:  Nutrition counsling. We discussed basic carb counting, including foods with carbs, label reading, portion size, and meal planning. We discussed strategies for weight loss, including balancing nutrients (carbs, protein, fat), portion control, healthy snacks, and exercise.   Goals:  1. 0.5-1 pound weight loss per week. Long term goal weight: under 200 pounds.  2. 2-3 carb servings at meals, 1  serving at snacks BID 3. Limit sugary drinks to no more than 12 ounces 3 times weekly. Can also mix half diet/half regular 4. Limit intake of fats (oil, butter, salad dressing, mayo) in diet.  5. Continue exercising 5 days a week for at least 30 minutes, up to 60 minutes if desired.   Handouts given during visit include:  Living Well with Diabetes booklet  Yellow portion card  Monitoring/Evaluation:  Dietary intake, exercise, BG, and body weight in 2 month(s).

## 2013-01-02 ENCOUNTER — Ambulatory Visit: Payer: BC Managed Care – PPO | Admitting: *Deleted

## 2013-10-13 ENCOUNTER — Other Ambulatory Visit (HOSPITAL_COMMUNITY)
Admission: RE | Admit: 2013-10-13 | Discharge: 2013-10-13 | Disposition: A | Discharge status: Home / Self Care | Payer: BC Managed Care – PPO | Source: Ambulatory Visit | Attending: Family Medicine | Admitting: Family Medicine

## 2013-10-13 ENCOUNTER — Other Ambulatory Visit: Payer: Self-pay | Admitting: Family Medicine

## 2013-10-13 DIAGNOSIS — Z113 Encounter for screening for infections with a predominantly sexual mode of transmission: Secondary | ICD-10-CM | POA: Insufficient documentation

## 2013-10-13 DIAGNOSIS — Z1151 Encounter for screening for human papillomavirus (HPV): Secondary | ICD-10-CM | POA: Insufficient documentation

## 2013-10-13 DIAGNOSIS — Z124 Encounter for screening for malignant neoplasm of cervix: Secondary | ICD-10-CM | POA: Insufficient documentation

## 2013-10-15 LAB — CYTOLOGY - PAP

## 2013-11-07 IMAGING — US US ABDOMEN COMPLETE
1 series · 14 of 25 positions shown · non-contrast
Comparison: None.

CLINICAL DATA: Abdominal pain.

COMPLETE ABDOMINAL ULTRASOUND

[Series 1: us abdomen complete · 0.34mm/px · 14 of 32 slices shown]
[im 1/32]
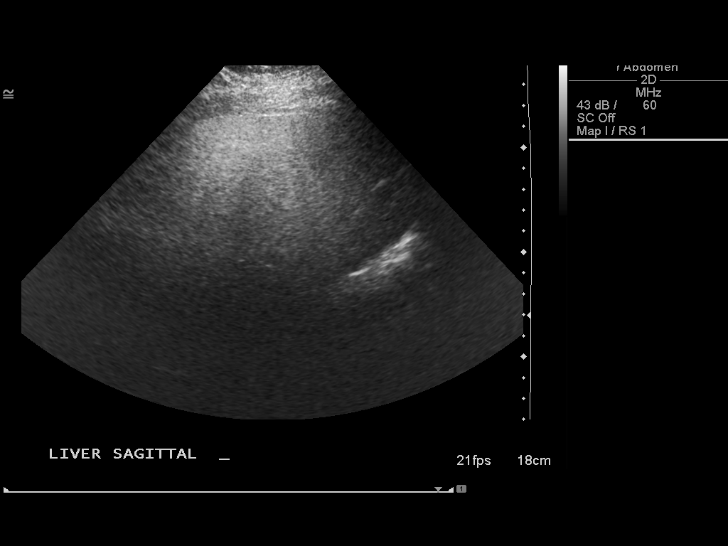
[im 3/32]
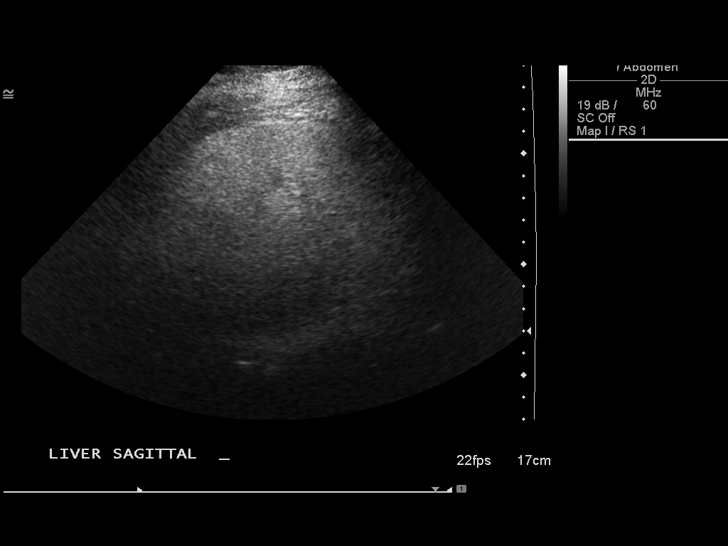
[im 6/32]
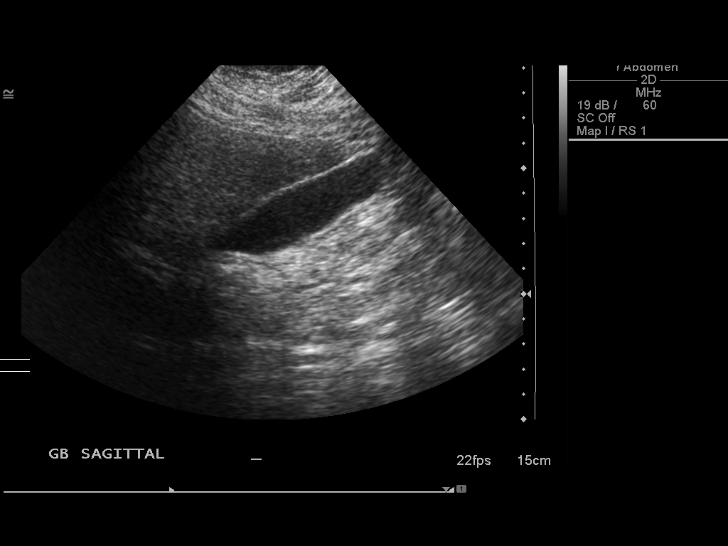
[im 8/32]
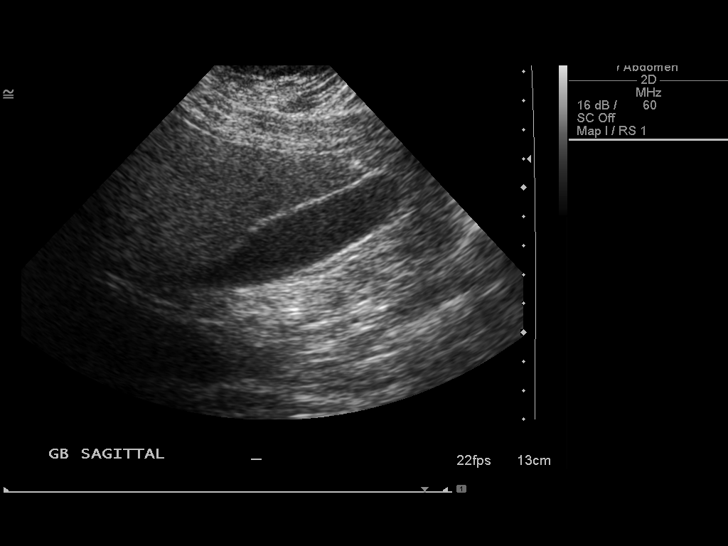
[im 11/32]
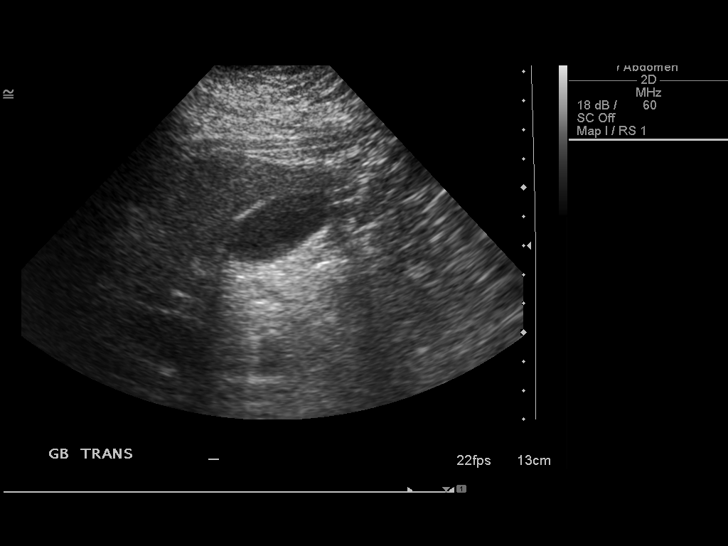
[im 12/32]
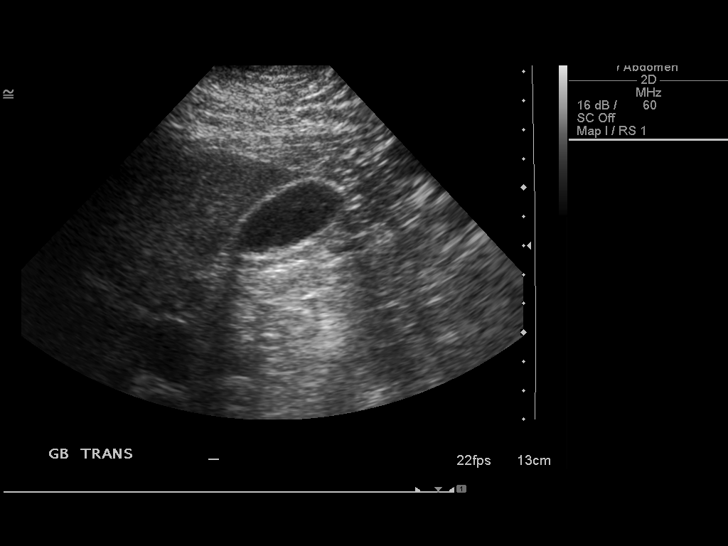
[im 15/32]
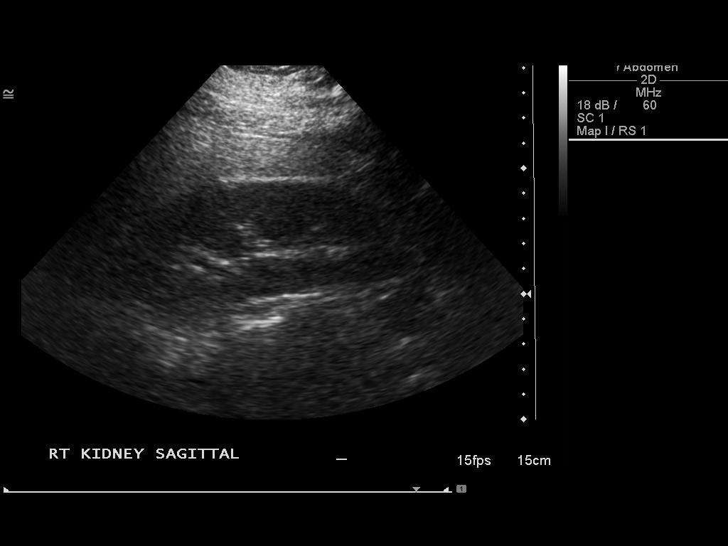
[im 17/32]
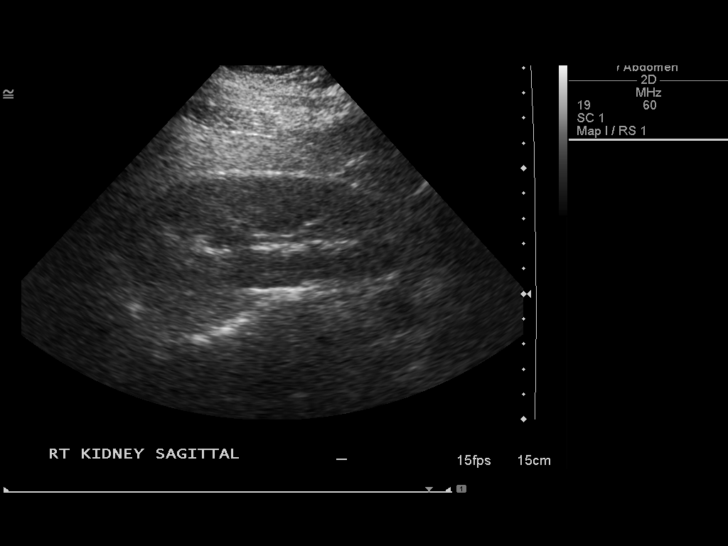
[im 20/32]
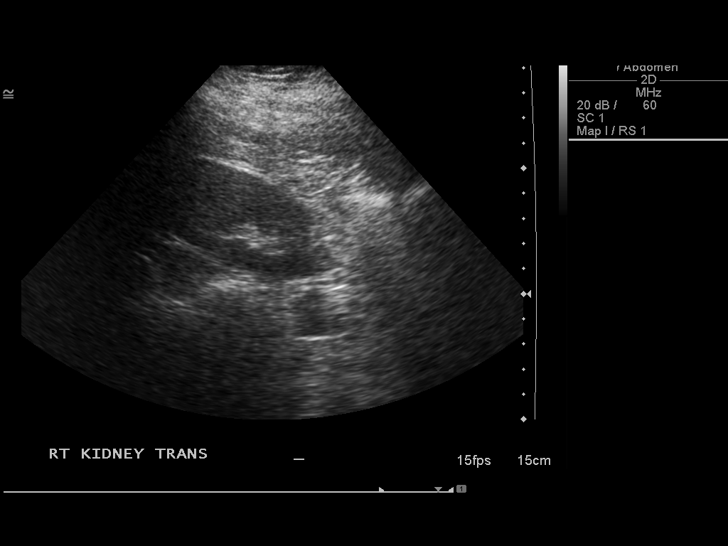
[im 21/32]
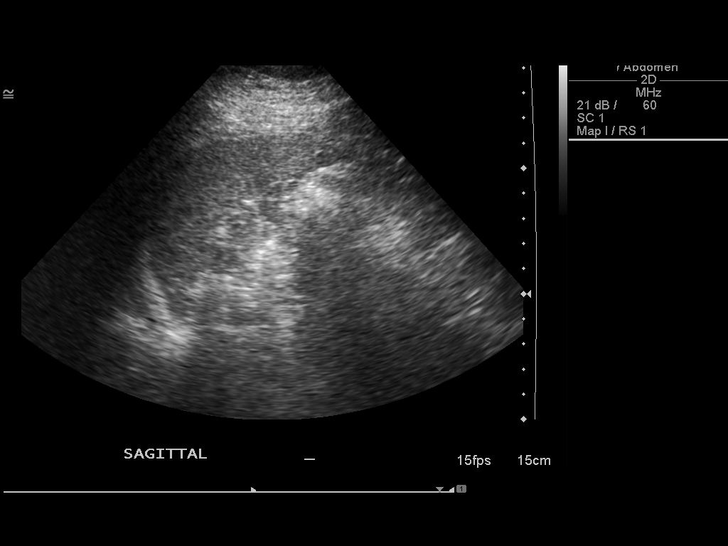
[im 24/32]
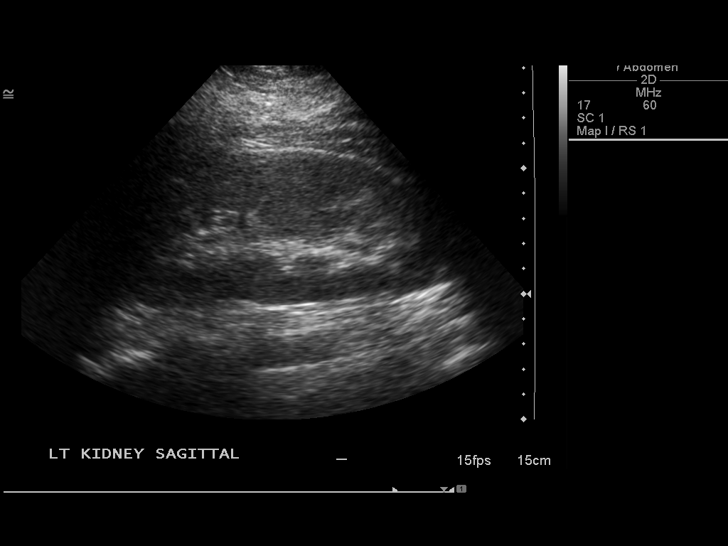
[im 26/32]
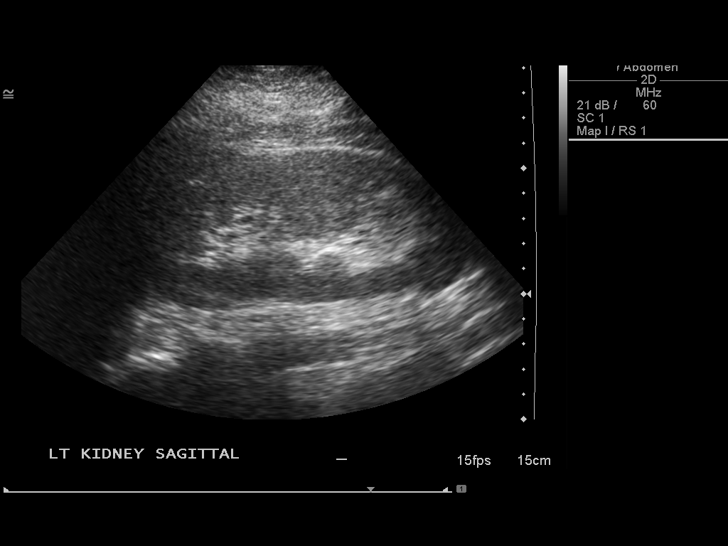
[im 29/32]
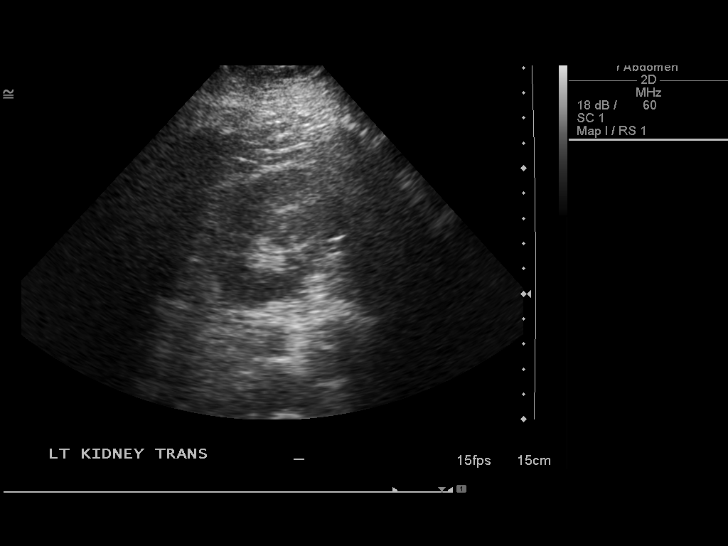
[im 32/32]
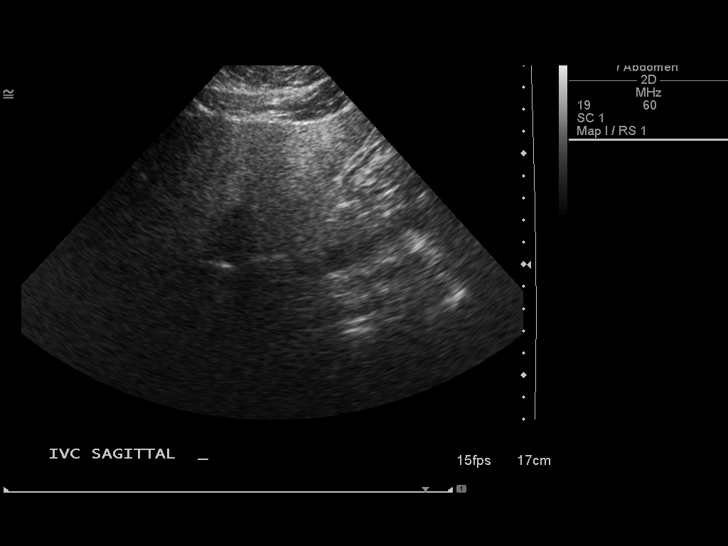

[14 of 25 positions shown; findings below may reference images not displayed]

FINDINGS: Gallbladder:  No gallstones, gallbladder wall thickening, or
pericholecystic fluid.

Common bile duct:   Normal caliber, 5 mm.

Liver:  Coarsened echotexture suggesting fatty infiltration or
intrinsic liver disease.

IVC:  Appears normal.

Pancreas:  No focal abnormality seen.

Spleen:  Within normal limits in size and echotexture.

Right Kidney:   Normal in size and parenchymal echogenicity.  No
evidence of mass or hydronephrosis.

Left Kidney:  Normal in size and parenchymal echogenicity.  No
evidence of mass or hydronephrosis.

Abdominal aorta:  No aneurysm identified.
IMPRESSION: Coarsened echotexture throughout the liver suggesting fatty
infiltration or intrinsic liver disease.

## 2013-12-24 ENCOUNTER — Ambulatory Visit (INDEPENDENT_AMBULATORY_CARE_PROVIDER_SITE_OTHER): Payer: BC Managed Care – PPO | Admitting: Family Medicine

## 2013-12-24 VITALS — BP 142/90 | HR 84 | Temp 99.5°F | Resp 18 | Ht 65.0 in | Wt 235.0 lb

## 2013-12-24 DIAGNOSIS — R112 Nausea with vomiting, unspecified: Secondary | ICD-10-CM

## 2013-12-24 DIAGNOSIS — E119 Type 2 diabetes mellitus without complications: Secondary | ICD-10-CM

## 2013-12-24 DIAGNOSIS — R509 Fever, unspecified: Secondary | ICD-10-CM

## 2013-12-24 DIAGNOSIS — E1310 Other specified diabetes mellitus with ketoacidosis without coma: Secondary | ICD-10-CM

## 2013-12-24 DIAGNOSIS — R197 Diarrhea, unspecified: Secondary | ICD-10-CM

## 2013-12-24 DIAGNOSIS — E669 Obesity, unspecified: Secondary | ICD-10-CM

## 2013-12-24 DIAGNOSIS — E1169 Type 2 diabetes mellitus with other specified complication: Secondary | ICD-10-CM

## 2013-12-24 LAB — POCT CBC
Granulocyte percent: 60.3 %G (ref 37–80)
HCT, POC: 42.3 % (ref 37.7–47.9)
HEMOGLOBIN: 12.9 g/dL (ref 12.2–16.2)
Lymph, poc: 2.9 (ref 0.6–3.4)
MCH: 26.7 pg — AB (ref 27–31.2)
MCHC: 30.4 g/dL — AB (ref 31.8–35.4)
MCV: 87.7 fL (ref 80–97)
MID (cbc): 0.4 (ref 0–0.9)
MPV: 7.9 fL (ref 0–99.8)
POC GRANULOCYTE: 5 (ref 2–6.9)
POC LYMPH PERCENT: 35 %L (ref 10–50)
POC MID %: 4.7 % (ref 0–12)
Platelet Count, POC: 366 10*3/uL (ref 142–424)
RBC: 4.82 M/uL (ref 4.04–5.48)
RDW, POC: 15.7 %
WBC: 8.3 10*3/uL (ref 4.6–10.2)

## 2013-12-24 LAB — POCT URINALYSIS DIPSTICK
Bilirubin, UA: NEGATIVE
Blood, UA: NEGATIVE
Glucose, UA: NEGATIVE
Ketones, UA: NEGATIVE
NITRITE UA: NEGATIVE
PROTEIN UA: NEGATIVE
Spec Grav, UA: 1.02
UROBILINOGEN UA: 2
pH, UA: 7

## 2013-12-24 LAB — COMPREHENSIVE METABOLIC PANEL
ALK PHOS: 70 U/L (ref 39–117)
ALT: 18 U/L (ref 0–35)
AST: 14 U/L (ref 0–37)
Albumin: 3.9 g/dL (ref 3.5–5.2)
BILIRUBIN TOTAL: 0.8 mg/dL (ref 0.2–1.2)
BUN: 6 mg/dL (ref 6–23)
CO2: 25 mEq/L (ref 19–32)
Calcium: 9.3 mg/dL (ref 8.4–10.5)
Chloride: 107 mEq/L (ref 96–112)
Creat: 0.84 mg/dL (ref 0.50–1.10)
Glucose, Bld: 125 mg/dL — ABNORMAL HIGH (ref 70–99)
Potassium: 4 mEq/L (ref 3.5–5.3)
SODIUM: 141 meq/L (ref 135–145)
TOTAL PROTEIN: 7.5 g/dL (ref 6.0–8.3)

## 2013-12-24 LAB — POCT URINE PREGNANCY: Preg Test, Ur: NEGATIVE

## 2013-12-24 LAB — POCT GLYCOSYLATED HEMOGLOBIN (HGB A1C): Hemoglobin A1C: 7.3

## 2013-12-24 LAB — GLUCOSE, POCT (MANUAL RESULT ENTRY): POC GLUCOSE: 133 mg/dL — AB (ref 70–99)

## 2013-12-24 NOTE — Patient Instructions (Signed)
Rest and drink plenty of fluids.  If you are getting worse or if the diarrhea comes back please let me know!  Otherwise I will be in touch with the rest of your labs.    You can go back to work either tomorrow or Friday depending on how you feel.

## 2013-12-24 NOTE — Progress Notes (Signed)
[facility] [address], [place] [id number] [phone]  Date:  12/24/2013   Name:  Wanda MolaKimberly Mercer   DOB:  Jun 09, 1980   MRN:  [id number]  PCP:  Ruthe Mannanalia Aron, MD    Chief Complaint: Diarrhea, Nausea, Emesis, Fever, Chills, Headache and Cough   History of Present Illness:  Wanda MolaKimberly Mercer is a 33 y.o. very pleasant female patient who presents with the following:  Here today with recurrent GI symptoms.  About 3 weeks ago she awoke "with extreme diarrhea, just water, extreme nausea but never vomited."  She also noted chills.  She went to her doctor and was treated with zofran.   Today is Wednesday. Diarrhea returned along with vomiting, fever, chills, and headache on Monday.  She also noted a cough.   No diarrhea since Monday, vomiting is also resolved.  She used aleve for her headache but does still have chills.   She also still notes a cough; this is dry in general.   She does not have any abdominal pain at this point.  She did have "a couple of cramps" with the diarrhea.  She is eating crackers and soup, but has not had much appetite.  She is able to drink liquids ok.   She has noted some ear discomfort but no sore throat.  Her PCP Dr. Uvaldo RisingMcNeil [facility] manages her DM.   She has nexplanon and does not get regular menses   Pulse Readings from Last 3 Encounters:  12/24/13 135  06/15/11 86  06/14/11 105     Patient Active Problem List   Diagnosis Date Noted  . GERD (gastroesophageal reflux disease) 06/06/2011  . Obesity, Class III, BMI 40-49.9 (morbid obesity) 06/06/2011  . Difficulty swallowing solids 05/31/2011  . Nausea 05/31/2011  . Black stools 05/31/2011  . Obesity 09/15/2010  . Tachycardia 09/15/2010  . Contraception management 09/15/2010  . Routine general medical examination at a health care facility 07/21/2010  . PCOS (polycystic ovarian syndrome)     Past Medical History  Diagnosis Date  . GERD (gastroesophageal reflux disease)   .  Obesity   . Diabetes mellitus without complication     Past Surgical History  Procedure Laterality Date  . Oral surgeries    . Hernia repair    . Colonoscopy    . Wisdom tooth extraction      History  Substance Use Topics  . Smoking status: Never Smoker   . Smokeless tobacco: Never Used  . Alcohol Use: Yes     Comment: occasionally    Family History  Problem Relation Age of Onset  . Ovarian cancer Maternal Aunt   . Diabetes Maternal Grandmother   . Diabetes Father   . Heart failure Father   . Emphysema Paternal Grandmother   . Diabetes Paternal Grandmother     Allergies  Allergen Reactions  . Penicillins Other (See Comments)    Stomach pain  . Propofol Other (See Comments)    "can't talk right."    Medication list has been reviewed and updated.  Current Outpatient Prescriptions on File Prior to Visit  Medication Sig Dispense Refill  . lisinopril (PRINIVIL,ZESTRIL) 5 MG tablet Take 5 mg by mouth daily.      . [DISCONTINUED] omeprazole (PRILOSEC) 20 MG capsule Take 20 mg by mouth daily.         No current facility-administered medications on file prior to visit.    Review of Systems:  As per HPI- otherwise negative.  Physical Examination: Filed Vitals:   12/24/13 1315  BP: 142/90  Pulse: 135  Temp: 99.6 F (37.6 C)  Resp: 18   Filed Vitals:   12/24/13 1315  Height: 5\' 5"  (1.651 m)  Weight: 235 lb (106.595 kg)   Body mass index is 39.11 kg/(m^2). Ideal Body Weight: Weight in (lb) to have BMI = 25: 149.9  GEN: WDWN, NAD, Non-toxic, A & O x 3, obese, looks well HEENT: Atraumatic, Normocephalic. Neck supple. No masses, No LAD. Ears and Nose: No external deformity. CV: RRR, No M/G/R. No JVD. No thrill. No extra heart sounds. PULM: CTA B, no wheezes, crackles, rhonchi. No retractions. No resp. distress. No accessory muscle use. ABD: S, NT, ND, +BS. No rebound. No HSM.  Benign exam at this time  EXTR: No c/c/e NEURO Normal gait.  PSYCH: Normally  interactive. Conversant. Not depressed or anxious appearing.  Calm demeanor.   Results for orders placed in visit on 12/24/13  POCT CBC      Result Value Ref Range   WBC 8.3  4.6 - 10.2 K/uL   Lymph, poc 2.9  0.6 - 3.4   POC LYMPH PERCENT 35.0  10 - 50 %L   MID (cbc) 0.4  0 - 0.9   POC MID % 4.7  0 - 12 %M   POC Granulocyte 5.0  2 - 6.9   Granulocyte percent 60.3  37 - 80 %G   RBC 4.82  4.04 - 5.48 M/uL   Hemoglobin 12.9  12.2 - 16.2 g/dL   HCT, POC 16.1  09.6 - 47.9 %   MCV 87.7  80 - 97 fL   MCH, POC 26.7 (*) 27 - 31.2 pg   MCHC 30.4 (*) 31.8 - 35.4 g/dL   RDW, POC 04.5     Platelet Count, POC 366  142 - 424 K/uL   MPV 7.9  0 - 99.8 fL  GLUCOSE, POCT (MANUAL RESULT ENTRY)      Result Value Ref Range   POC Glucose 133 (*) 70 - 99 mg/dl  POCT GLYCOSYLATED HEMOGLOBIN (HGB A1C)      Result Value Ref Range   Hemoglobin A1C 7.3    POCT URINALYSIS DIPSTICK      Result Value Ref Range   Color, UA yellow     Clarity, UA cloudy     Glucose, UA neg     Bilirubin, UA neg     Ketones, UA neg     Spec Grav, UA 1.020     Blood, UA neg     pH, UA 7.0     Protein, UA neg     Urobilinogen, UA 2.0     Nitrite, UA neg     Leukocytes, UA Trace    POCT URINE PREGNANCY      Result Value Ref Range   Preg Test, Ur Negative       Assessment and Plan: Fever, unspecified fever cause - Plan: POCT CBC  Non-intractable vomiting with nausea, vomiting of unspecified type - Plan: Comprehensive metabolic panel, POCT urine pregnancy  Diarrhea - Plan: Comprehensive metabolic panel  DM (diabetes mellitus) type 2, uncontrolled, with ketoacidosis - Plan: POCT glucose (manual entry), POCT glycosylated hemoglobin (Hb A1C), POCT urinalysis dipstick  Recurrent nausea, vomiting and diarrhea that are now resolved.  Overall she is feeling better.  Likely due to a viral illness- suspect these are 2 separate illnesses.  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  She  will rest, push fluids and continue bland diet, let me know if not improving over the next couple of days Will plan further follow- up pending labs.  See patient instructions for more details.     Signed Abbe Amsterdam, MD

## 2013-12-25 ENCOUNTER — Encounter: Payer: Self-pay | Admitting: Family Medicine

## 2013-12-29 ENCOUNTER — Telehealth: Payer: Self-pay

## 2013-12-29 NOTE — Telephone Encounter (Signed)
Lm for rtn call- If pt is only running a low grade fever without any symptoms she should monitor and increase fluids. If having any other symptoms or increase in temp beyond 100.5 Pt should RTC. She had diarrhea and nausea before is she having these symptoms again?

## 2013-12-29 NOTE — Telephone Encounter (Signed)
Spoke to pt- advised to RTC if symptoms are not improving. She may take ibuprofen or tylenol to help with the fever.

## 2013-12-29 NOTE — Telephone Encounter (Signed)
Pt states she is still running a low grade fever,please advise   Best phone for pt is [phone]   Pharmacy [facility]

## 2014-05-26 ENCOUNTER — Ambulatory Visit (INDEPENDENT_AMBULATORY_CARE_PROVIDER_SITE_OTHER): Payer: BC Managed Care – PPO | Admitting: Physician Assistant

## 2014-05-26 VITALS — BP 136/78 | HR 105 | Temp 99.3°F | Resp 16 | Ht 64.5 in | Wt 241.4 lb

## 2014-05-26 DIAGNOSIS — L0202 Furuncle of face: Secondary | ICD-10-CM

## 2014-05-26 DIAGNOSIS — L03211 Cellulitis of face: Secondary | ICD-10-CM

## 2014-05-26 DIAGNOSIS — L0203 Carbuncle of face: Secondary | ICD-10-CM | POA: Diagnosis not present

## 2014-05-26 DIAGNOSIS — L0201 Cutaneous abscess of face: Secondary | ICD-10-CM

## 2014-05-26 MED ORDER — MUPIROCIN 2 % EX OINT: 1.0000 "application " | TOPICAL_OINTMENT | Freq: Three times a day (TID) | CUTANEOUS | Status: DC

## 2014-05-26 MED ORDER — DOXYCYCLINE HYCLATE 100 MG PO CAPS: 100.0000 mg | ORAL_CAPSULE | Freq: Two times a day (BID) | ORAL | Status: AC

## 2014-05-26 NOTE — Progress Notes (Signed)
[facility] [address], [place] [address] [phone]  Date:  05/26/2014   Name:  Antonea Gaut   DOB:  1980/08/05   MRN:  [id number]  PCP:  Ruthe Mannan, MD    Chief Complaint: Spots on Face   History of Present Illness:  Elaynah Virginia is a 34 y.o. very pleasant female patient who presents with the following:  6 days ago, appeared as a mass of bumps, and then had a white head to it.  There was soreness,  With some itching.  There was draining with squeezing, as she attempted to rid herself of the bumpt which she thought was an ingrown hair.  No numbness or tingling in the face.  She denies chills, malaise, nausea, vomiting, dizziness, dyspnea, or sob.    She attempted neosporin which did not help.  3 weeks ago to take aleve for a week, but it started to hurt at left ear pain.    Patient Active Problem List   Diagnosis Date Noted  . GERD (gastroesophageal reflux disease) 06/06/2011  . Obesity, Class III, BMI 40-49.9 (morbid obesity) 06/06/2011  . Difficulty swallowing solids 05/31/2011  . Nausea 05/31/2011  . Black stools 05/31/2011  . Obesity 09/15/2010  . Tachycardia 09/15/2010  . Contraception management 09/15/2010  . Routine general medical examination at a health care facility 07/21/2010  . PCOS (polycystic ovarian syndrome)     Past Medical History  Diagnosis Date  . GERD (gastroesophageal reflux disease)   . Obesity   . Diabetes mellitus without complication     Past Surgical History  Procedure Laterality Date  . Oral surgeries    . Hernia repair    . Colonoscopy    . Wisdom tooth extraction      History  Substance Use Topics  . Smoking status: Never Smoker   . Smokeless tobacco: Never Used  . Alcohol Use: Yes     Comment: occasionally    Family History  Problem Relation Age of Onset  . Ovarian cancer Maternal Aunt   . Diabetes Maternal Grandmother   . Diabetes Father   . Heart failure Father   . Emphysema Paternal  Grandmother   . Diabetes Paternal Grandmother     Allergies  Allergen Reactions  . Penicillins Other (See Comments)    Stomach pain  . Propofol Other (See Comments)    "can't talk right."    Medication list has been reviewed and updated.  Current Outpatient Prescriptions on File Prior to Visit  Medication Sig Dispense Refill  . etonogestrel (NEXPLANON) 68 MG IMPL implant Inject 1 each into the skin once.    Marland Kitchen glimepiride (AMARYL) 4 MG tablet Take 4 mg by mouth daily with breakfast.    . lisinopril (PRINIVIL,ZESTRIL) 5 MG tablet Take 5 mg by mouth daily.    . sitaGLIPtin (JANUVIA) 100 MG tablet Take 100 mg by mouth daily.    . [DISCONTINUED] omeprazole (PRILOSEC) 20 MG capsule Take 20 mg by mouth daily.       No current facility-administered medications on file prior to visit.    Review of Systems: ROS otherwise unremarkable unless listed above.    Physical Examination: Filed Vitals:   05/26/14 1912  BP: 136/78  Pulse: 105  Temp: 99.3 F (37.4 C)  Resp: 16   Filed Vitals:   05/26/14 1912  Height: 5' 4.5" (1.638 m)  Weight: 241 lb 6 oz (109.487 kg)   Body mass index is 40.81 kg/(m^2). Ideal Body  Weight: Weight in (lb) to have BMI = 25: 147.6  Physical Exam  Constitutional: She is oriented to person, place, and time. She appears well-developed and well-nourished. No distress.  Eyes: EOM are normal. Pupils are equal, round, and reactive to light.  Neck: Normal range of motion.  Pulmonary/Chest: Effort normal and breath sounds normal. No respiratory distress. She has no wheezes.  Lymphadenopathy:       Head (right side): No submandibular and no tonsillar adenopathy present.       Head (left side): No submandibular and no tonsillar adenopathy present.    She has no cervical adenopathy.  Neurological: She is alert and oriented to person, place, and time.  Skin:  Right side of chin with carbuncle with three open exudative abscesses.  Scattered small abscesses without  erythema or drainage along the submandibular.  Multiple thick hairs along the chin.   Psychiatric: She has a normal mood and affect. Her behavior is normal.   Cleansed with alcohol swab. 1% lidocaine injected for anesthesia.  Forcep used to remove thick conglomerated exudate.  Rinsed with normal saline.  Assessment and Plan: 34 year old female is here today for chief complaint of bump on face.  Due to the place of wound, did not attempt to cut deeper with blade to remove drainage and hair.  Exudate removed to help with healing.  Bulk of healing will be due to antibiotic.  Will cover for MRSA, this will hopefull resolve miscellaneous abscess.  If no improvement within 1 week, will consider opening for full drainage and healing by secondary intention.  Carbuncle and furuncle of face  Cellulitis and abscess of face - Plan: mupirocin ointment (BACTROBAN) 2 %, doxycycline (VIBRAMYCIN) 100 MG capsule, Wound culture  Trena PlattStephanie English, PA-C [facility] 3/10/20168:04 PM

## 2014-05-26 NOTE — Patient Instructions (Signed)
Abscess °An abscess (boil or furuncle) is an infected area on or under the skin. This area is filled with yellowish-white fluid (pus) and other material (debris). °HOME CARE  °· Only take medicines as told by your doctor. °· If you were given antibiotic medicine, take it as directed. Finish the medicine even if you start to feel better. °· If gauze is used, follow your doctor's directions for changing the gauze. °· To avoid spreading the infection: °¨ Keep your abscess covered with a bandage. °¨ Wash your hands well. °¨ Do not share personal care items, towels, or whirlpools with others. °¨ Avoid skin contact with others. °· Keep your skin and clothes clean around the abscess. °· Keep all doctor visits as told. °GET HELP RIGHT AWAY IF:  °· You have more pain, puffiness (swelling), or redness in the wound site. °· You have more fluid or blood coming from the wound site. °· You have muscle aches, chills, or you feel sick. °· You have a fever. °MAKE SURE YOU:  °· Understand these instructions. °· Will watch your condition. °· Will get help right away if you are not doing well or get worse. °Document Released: 08/23/2007 Document Revised: 09/05/2011 Document Reviewed: 05/19/2011 °ExitCare® Patient Information ©2015 [employer]. This information is not intended to replace advice given to you by your health care provider. Make sure you discuss any questions you have with your health care provider. ° °

## 2014-05-29 LAB — WOUND CULTURE: Gram Stain: NONE SEEN

## 2014-12-26 ENCOUNTER — Encounter (HOSPITAL_COMMUNITY): Payer: Self-pay | Admitting: Nurse Practitioner

## 2014-12-26 ENCOUNTER — Emergency Department (HOSPITAL_COMMUNITY)
Admission: EM | Admit: 2014-12-26 | Discharge: 2014-12-26 | Disposition: A | Discharge status: Other Acute Inpatient Hospital | Payer: BC Managed Care – PPO | Attending: Emergency Medicine | Admitting: Emergency Medicine

## 2014-12-26 ENCOUNTER — Emergency Department (HOSPITAL_COMMUNITY): Payer: BC Managed Care – PPO

## 2014-12-26 DIAGNOSIS — R4701 Aphasia: Secondary | ICD-10-CM

## 2014-12-26 DIAGNOSIS — R4789 Other speech disturbances: Secondary | ICD-10-CM | POA: Diagnosis not present

## 2014-12-26 DIAGNOSIS — I10 Essential (primary) hypertension: Secondary | ICD-10-CM

## 2014-12-26 DIAGNOSIS — R9431 Abnormal electrocardiogram [ECG] [EKG]: Secondary | ICD-10-CM

## 2014-12-26 DIAGNOSIS — R739 Hyperglycemia, unspecified: Secondary | ICD-10-CM

## 2014-12-26 LAB — RAPID URINE DRUG SCREEN, HOSP PERFORMED
AMPHETAMINES: NOT DETECTED
BARBITURATES: NOT DETECTED
Benzodiazepines: NOT DETECTED
COCAINE: NOT DETECTED
Opiates: NOT DETECTED
TETRAHYDROCANNABINOL: NOT DETECTED

## 2014-12-26 LAB — COMPREHENSIVE METABOLIC PANEL
ALT: 34 U/L (ref 14–54)
AST: 35 U/L (ref 15–41)
Albumin: 3.7 g/dL (ref 3.5–5.0)
Alkaline Phosphatase: 79 U/L (ref 38–126)
Anion gap: 11 (ref 5–15)
CHLORIDE: 104 mmol/L (ref 101–111)
CO2: 22 mmol/L (ref 22–32)
CREATININE: 0.83 mg/dL (ref 0.44–1.00)
Calcium: 9.2 mg/dL (ref 8.9–10.3)
GFR calc non Af Amer: >60 mL/min (ref >60)
Glucose, Bld: 180 mg/dL — ABNORMAL HIGH (ref 65–99)
Potassium: 3.7 mmol/L (ref 3.5–5.1)
SODIUM: 137 mmol/L (ref 135–145)
Total Bilirubin: 0.7 mg/dL (ref 0.3–1.2)
Total Protein: 7.9 g/dL (ref 6.5–8.1)

## 2014-12-26 LAB — SALICYLATE LEVEL

## 2014-12-26 LAB — CBC WITH DIFFERENTIAL/PLATELET
BASOS ABS: 0 10*3/uL (ref 0.0–0.1)
Basophils Relative: 0 %
EOS ABS: 0 10*3/uL (ref 0.0–0.7)
EOS PCT: 0 %
HCT: 42.5 % (ref 36.0–46.0)
Hemoglobin: 13.7 g/dL (ref 12.0–15.0)
Lymphocytes Relative: 41 %
Lymphs Abs: 3.2 10*3/uL (ref 0.7–4.0)
MCH: 28 pg (ref 26.0–34.0)
MCHC: 32.2 g/dL (ref 30.0–36.0)
MCV: 86.9 fL (ref 78.0–100.0)
Monocytes Absolute: 0.3 10*3/uL (ref 0.1–1.0)
Monocytes Relative: 4 %
Neutro Abs: 4.3 10*3/uL (ref 1.7–7.7)
Neutrophils Relative %: 55 %
PLATELETS: 361 10*3/uL (ref 150–400)
RBC: 4.89 MIL/uL (ref 3.87–5.11)
RDW: 13.2 % (ref 11.5–15.5)
WBC: 7.9 10*3/uL (ref 4.0–10.5)

## 2014-12-26 LAB — URINALYSIS, ROUTINE W REFLEX MICROSCOPIC
BILIRUBIN URINE: NEGATIVE
Glucose, UA: NEGATIVE mg/dL
Hgb urine dipstick: NEGATIVE
Ketones, ur: 15 mg/dL — AB
LEUKOCYTES UA: NEGATIVE
NITRITE: NEGATIVE
PH: 7 (ref 5.0–8.0)
Protein, ur: NEGATIVE mg/dL
SPECIFIC GRAVITY, URINE: 1.023 (ref 1.005–1.030)
Urobilinogen, UA: 1 mg/dL (ref 0.0–1.0)

## 2014-12-26 LAB — PROTIME-INR
INR: 1.16 (ref 0.00–1.49)
Prothrombin Time: 14.9 seconds (ref 11.6–15.2)

## 2014-12-26 LAB — ETHANOL: Alcohol, Ethyl (B): <5 mg/dL (ref <5)

## 2014-12-26 LAB — APTT: APTT: 31 s (ref 24–37)

## 2014-12-26 LAB — POC URINE PREG, ED: Preg Test, Ur: NEGATIVE

## 2014-12-26 LAB — I-STAT TROPONIN, ED: TROPONIN I, POC: 0 ng/mL (ref 0.00–0.08)

## 2014-12-26 LAB — CBG MONITORING, ED: Glucose-Capillary: 131 mg/dL — ABNORMAL HIGH (ref 65–99)

## 2014-12-26 LAB — ACETAMINOPHEN LEVEL: Acetaminophen (Tylenol), Serum: <10 ug/mL — ABNORMAL LOW (ref 10–30)

## 2014-12-26 MED ORDER — LORAZEPAM 2 MG/ML IJ SOLN
1.0000 mg | Freq: Once | INTRAMUSCULAR | Status: AC
  Administered 2014-12-26: 1 mg via INTRAVENOUS
  Filled 2014-12-26: qty 1

## 2014-12-26 MED ORDER — SODIUM CHLORIDE 0.9 % IV BOLUS (SEPSIS)
1000.0000 mL | Freq: Once | INTRAVENOUS | Status: AC
  Administered 2014-12-26: 1000 mL via INTRAVENOUS

## 2014-12-26 MED ORDER — LISINOPRIL 5 MG PO TABS: 5.0000 mg | ORAL_TABLET | Freq: Every day | ORAL | Status: DC

## 2014-12-26 MED ORDER — ALUM & MAG HYDROXIDE-SIMETH 200-200-20 MG/5ML PO SUSP: 30.0000 mL | ORAL | Status: DC | PRN

## 2014-12-26 MED ORDER — LINAGLIPTIN 5 MG PO TABS: 5.0000 mg | ORAL_TABLET | Freq: Every day | ORAL | Status: DC

## 2014-12-26 MED ORDER — IBUPROFEN 200 MG PO TABS: 600.0000 mg | ORAL_TABLET | Freq: Three times a day (TID) | ORAL | Status: DC | PRN

## 2014-12-26 MED ORDER — ACETAMINOPHEN 325 MG PO TABS: 650.0000 mg | ORAL_TABLET | ORAL | Status: DC | PRN

## 2014-12-26 MED ORDER — GLIMEPIRIDE 4 MG PO TABS
4.0000 mg | ORAL_TABLET | Freq: Every day | ORAL | Status: DC
  Filled 2014-12-26: qty 1

## 2014-12-26 MED ORDER — LORAZEPAM 1 MG PO TABS: 1.0000 mg | ORAL_TABLET | Freq: Three times a day (TID) | ORAL | Status: DC | PRN

## 2014-12-26 MED ORDER — ZOLPIDEM TARTRATE 5 MG PO TABS: 5.0000 mg | ORAL_TABLET | Freq: Every evening | ORAL | Status: DC | PRN
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
## 2014-12-26 NOTE — ED Notes (Signed)
Pt ambulatory to restroom with steady gait.

## 2014-12-26 NOTE — ED Notes (Signed)
Pt. To [facility] from [facility] ED ambulatory without difficulty, to room 38. Report from [facility]. Pt. Is alert, warm and dry in no distress. Pt. Denies SI, HI, and AVH by nodding her head in response to questions. Pt. Calm and cooperative, tearfull. Pt. Made aware of security cameras and Q15 minute rounds. Pt. Encouraged to let Nursing staff know of any concerns or needs.
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
## 2014-12-26 NOTE — ED Notes (Signed)
Attempted report 
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
## 2014-12-26 NOTE — BH Assessment (Signed)
Assessment completed. Consulted Maryjean Morn, PA-C who agrees that pt does not meet inpatient criteria at this time. It is recommended that pt follow up with her outpatient provider, Fenton Malling at [facility] for emotional decompression. Dr. Dalene Seltzer has been informed that pt does not meet inpatient criteria at this time and should follow up with her outpatient provider. Pt and her mother have been informed of the recommendation and is agreeable to the plan.
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
## 2014-12-26 NOTE — ED Notes (Signed)
Pt. Noted in room. No complaints or concerns voiced. No distress or abnormal behavior noted. Will continue to monitor with security cameras. Q 15 minute rounds continue. 

## 2014-12-26 NOTE — ED Notes (Signed)
Pt. Noted in room with visitor and TTS. No complaints or concerns voiced. No distress or abnormal behavior noted. Will continue to monitor with security cameras. Q 15 minute rounds continue.

## 2014-12-26 NOTE — ED Notes (Signed)
[facility] contacted for transport 

## 2015-06-22 ENCOUNTER — Ambulatory Visit (INDEPENDENT_AMBULATORY_CARE_PROVIDER_SITE_OTHER): Payer: BC Managed Care – PPO

## 2015-06-22 ENCOUNTER — Ambulatory Visit (INDEPENDENT_AMBULATORY_CARE_PROVIDER_SITE_OTHER): Payer: BC Managed Care – PPO | Admitting: Physician Assistant

## 2015-06-22 VITALS — BP 138/86 | HR 110 | Temp 99.2°F | Resp 16 | Ht 65.0 in | Wt 243.4 lb

## 2015-06-22 DIAGNOSIS — R05 Cough: Secondary | ICD-10-CM

## 2015-06-22 DIAGNOSIS — R059 Cough, unspecified: Secondary | ICD-10-CM

## 2015-06-22 MED ORDER — HYDROCOD POLST-CPM POLST ER 10-8 MG/5ML PO SUER: 5.0000 mL | Freq: Every evening | ORAL | Status: AC | PRN

## 2015-06-22 MED ORDER — GUAIFENESIN ER 1200 MG PO TB12: 1.0000 | ORAL_TABLET | Freq: Two times a day (BID) | ORAL | Status: AC | PRN

## 2015-06-22 NOTE — Patient Instructions (Addendum)
IF you received an x-ray today, you will receive an invoice from [facility]. Please contact [facility] at [phone] with questions or concerns regarding your invoice.   IF you received labwork today, you will receive an invoice from [facility]. Please contact [facility] at [phone] with questions or concerns regarding your invoice.   Our billing staff will not be able to assist you with questions regarding bills from these companies.  You will be contacted with the lab results as soon as they are available. The fastest way to get your results is to activate your My Chart account. Instructions are located on the last page of this paperwork. If you have not heard from us regarding the results in 2 weeks, please contact this office.    We will treat you supportively at this time. Please drink plenty of water.   Acute Bronchitis Bronchitis is inflammation of the airways that extend from the windpipe into the lungs (bronchi). The inflammation often causes mucus to develop. This leads to a cough, which is the most common symptom of bronchitis.  In acute bronchitis, the condition usually develops suddenly and goes away over time, usually in a couple weeks. Smoking, allergies, and asthma can make bronchitis worse. Repeated episodes of bronchitis may cause further lung problems.  CAUSES Acute bronchitis is most often caused by the same virus that causes a cold. The virus can spread from person to person (contagious) through coughing, sneezing, and touching contaminated objects. SIGNS AND SYMPTOMS   Cough.   Fever.   Coughing up mucus.   Body aches.   Chest congestion.   Chills.   Shortness of breath.   Sore throat.  DIAGNOSIS  Acute bronchitis is usually diagnosed through a physical exam. Your health care provider will also ask you questions about your medical history. Tests, such as chest X-rays, are sometimes done to rule out  other conditions.  TREATMENT  Acute bronchitis usually goes away in a couple weeks. Oftentimes, no medical treatment is necessary. Medicines are sometimes given for relief of fever or cough. Antibiotic medicines are usually not needed but may be prescribed in certain situations. In some cases, an inhaler may be recommended to help reduce shortness of breath and control the cough. A cool mist vaporizer may also be used to help thin bronchial secretions and make it easier to clear the chest.  HOME CARE INSTRUCTIONS  Get plenty of rest.   Drink enough fluids to keep your urine clear or pale yellow (unless you have a medical condition that requires fluid restriction). Increasing fluids may help thin your respiratory secretions (sputum) and reduce chest congestion, and it will prevent dehydration.   Take medicines only as directed by your health care provider.  If you were prescribed an antibiotic medicine, finish it all even if you start to feel better.  Avoid smoking and secondhand smoke. Exposure to cigarette smoke or irritating chemicals will make bronchitis worse. If you are a smoker, consider using nicotine gum or skin patches to help control withdrawal symptoms. Quitting smoking will help your lungs heal faster.   Reduce the chances of another bout of acute bronchitis by washing your hands frequently, avoiding people with cold symptoms, and trying not to touch your hands to your mouth, nose, or eyes.   Keep all follow-up visits as directed by your health care provider.  SEEK MEDICAL CARE IF: Your symptoms do not improve after 1 week of treatment.  SEEK IMMEDIATE MEDICAL CARE IF:  You develop  an increased fever or chills.   You have chest pain.   You have severe shortness of breath.  You have bloody sputum.   You develop dehydration.  You faint or repeatedly feel like you are going to pass out.  You develop repeated vomiting.  You develop a severe headache. MAKE SURE  YOU:   Understand these instructions.  Will watch your condition.  Will get help right away if you are not doing well or get worse.   This information is not intended to replace advice given to you by your health care provider. Make sure you discuss any questions you have with your health care provider.   Document Released: 04/13/2004 Document Revised: 03/27/2014 Document Reviewed: 08/27/2012 Elsevier Interactive Patient Education [employer].

## 2015-06-22 NOTE — Progress Notes (Signed)
[facility] [address], [place] [address] [phone]  Date:  06/22/2015   Name:  Wanda Mercer   DOB:  1980/08/31   MRN:  [id number]  PCP:  Gweneth Dimitri, MD   History of Present Illness:  Wanda Mercer is a 35 y.o. female patient who presents to [facility] for cough for 5 days.  Productive cough of light green sputum.  This has since improved.  She has no sob or dyspnea.  Feels mildly winded with daily activities.  There is more productive cough in the morning.  She has congestion.  Fever last night of 101.  This has improved.  She is currently taking theraflu.  No gi illness of abdominal pain or nausea, or diarrhea.   Patient Active Problem List   Diagnosis Date Noted  . GERD (gastroesophageal reflux disease) 06/06/2011  . Obesity, Class III, BMI 40-49.9 (morbid obesity) (HCC) 06/06/2011  . Difficulty swallowing solids 05/31/2011  . Nausea 05/31/2011  . Black stools 05/31/2011  . Obesity 09/15/2010  . Tachycardia 09/15/2010  . Contraception management 09/15/2010  . Routine general medical examination at a health care facility 07/21/2010  . PCOS (polycystic ovarian syndrome)     Past Medical History  Diagnosis Date  . GERD (gastroesophageal reflux disease)   . Obesity   . Diabetes mellitus without complication (HCC)   . Conversion disorder     12/2014    Past Surgical History  Procedure Laterality Date  . Oral surgeries    . Hernia repair    . Colonoscopy    . Wisdom tooth extraction      Social History  Substance Use Topics  . Smoking status: Never Smoker   . Smokeless tobacco: Never Used  . Alcohol Use: Yes     Comment: occasionally    Family History  Problem Relation Age of Onset  . Ovarian cancer Maternal Aunt   . Diabetes Maternal Grandmother   . Diabetes Father   . Heart failure Father   . Emphysema Paternal Grandmother   . Diabetes Paternal Grandmother     Allergies  Allergen Reactions  . Penicillins Other (See Comments)     Stomach pain  . Propofol Other (See Comments)    "can't talk right."    Medication list has been reviewed and updated.  Current Outpatient Prescriptions on File Prior to Visit  Medication Sig Dispense Refill  . etonogestrel (NEXPLANON) 68 MG IMPL implant Inject 1 each into the skin once.    Marland Kitchen glimepiride (AMARYL) 4 MG tablet Take 4 mg by mouth daily with breakfast.    . lisinopril (PRINIVIL,ZESTRIL) 5 MG tablet Take 5 mg by mouth daily.    . sitaGLIPtin (JANUVIA) 100 MG tablet Take 100 mg by mouth daily.    . Naproxen Sodium (ALEVE) 220 MG CAPS Take 440 mg by mouth 2 (two) times daily as needed (pain). Reported on 06/22/2015    . [DISCONTINUED] omeprazole (PRILOSEC) 20 MG capsule Take 20 mg by mouth daily.       No current facility-administered medications on file prior to visit.    ROS ROS otherwise unremarkable unless listed above.   Physical Examination: BP 138/86 mmHg  Pulse 110  Temp(Src) 99.2 F (37.3 C) (Oral)  Resp 16  Ht  (1.651 m)  Wt 243 lb 6.4 oz (110.406 kg)  BMI 40.50 kg/m2  SpO2 97% Ideal Body Weight: Weight in (lb) to have BMI = 25: 149.9  Physical Exam  Constitutional: She is oriented  to person, place, and time. She appears well-developed and well-nourished. No distress.  HENT:  Head: Normocephalic and atraumatic.  Right Ear: Tympanic membrane, external ear and ear canal normal.  Left Ear: Tympanic membrane, external ear and ear canal normal.  Nose: Mucosal edema and rhinorrhea present. Right sinus exhibits no maxillary sinus tenderness and no frontal sinus tenderness. Left sinus exhibits no maxillary sinus tenderness and no frontal sinus tenderness.  Mouth/Throat: No uvula swelling. No oropharyngeal exudate, posterior oropharyngeal edema or posterior oropharyngeal erythema.  Eyes: Conjunctivae and EOM are normal. Pupils are equal, round, and reactive to light.  Cardiovascular: Normal rate and regular rhythm.  Exam reveals no gallop, no distant heart  sounds and no friction rub.   No murmur heard. Pulmonary/Chest: Effort normal. No respiratory distress. She has no decreased breath sounds. She has no wheezes. She has no rhonchi.  Lymphadenopathy:       Head (right side): No submandibular, no tonsillar, no preauricular and no posterior auricular adenopathy present.       Head (left side): No submandibular, no tonsillar, no preauricular and no posterior auricular adenopathy present.  Neurological: She is alert and oriented to person, place, and time.  Skin: She is not diaphoretic.  Psychiatric: She has a normal mood and affect. Her behavior is normal.    Dg Chest 2 View  06/22/2015  CLINICAL DATA:  Cough and body aches EXAM: CHEST  2 VIEW COMPARISON:  None. FINDINGS: Low volumes. Normal heart size. Lungs clear. No pneumothorax. No pleural effusion. IMPRESSION: No active cardiopulmonary disease. Electronically Signed   By: Jolaine ClickArthur  Hoss M.D.   On: 06/22/2015 14:46    Assessment and Plan: Wendall MolaKimberly Bardsley is a 35 y.o. female who is here today for cc of cough. This appears viral that is improving.  Vitals normal.  Lungs clear.  I have advised that she return in 5 days, or contact me if there is no improvement.  Cough - Plan: DG Chest 2 View, [employer](MUCINEX MAXIMUM STRENGTH) 1200 MG TB12, chlorpheniramine-HYDROcodone (TUSSIONEX PENNKINETIC ER) 10-8 MG/5ML SUER  Trena PlattStephanie Braiden Rodman, PA-C [facility] 06/22/2015 2:52 PM

## 2016-11-24 ENCOUNTER — Other Ambulatory Visit: Payer: Self-pay | Admitting: Family Medicine

## 2016-11-24 DIAGNOSIS — R1013 Epigastric pain: Secondary | ICD-10-CM

## 2016-12-08 ENCOUNTER — Ambulatory Visit
Admission: RE | Admit: 2016-12-08 | Discharge: 2016-12-08 | Disposition: A | Discharge status: Home / Self Care | Payer: BC Managed Care – PPO | Source: Ambulatory Visit | Attending: Family Medicine | Admitting: Family Medicine

## 2016-12-08 DIAGNOSIS — R1013 Epigastric pain: Secondary | ICD-10-CM

## 2017-04-18 ENCOUNTER — Other Ambulatory Visit: Payer: Self-pay | Admitting: Family Medicine

## 2017-04-18 ENCOUNTER — Other Ambulatory Visit (HOSPITAL_COMMUNITY)
Admission: RE | Admit: 2017-04-18 | Discharge: 2017-04-18 | Disposition: A | Discharge status: Home / Self Care | Payer: BC Managed Care – PPO | Source: Ambulatory Visit | Attending: Family Medicine | Admitting: Family Medicine

## 2017-04-18 DIAGNOSIS — Z124 Encounter for screening for malignant neoplasm of cervix: Secondary | ICD-10-CM | POA: Diagnosis not present

## 2017-04-20 LAB — CYTOLOGY - PAP
CHLAMYDIA, DNA PROBE: NEGATIVE
DIAGNOSIS: NEGATIVE
Neisseria Gonorrhea: NEGATIVE
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
## 2019-05-14 ENCOUNTER — Other Ambulatory Visit: Payer: Self-pay | Admitting: Family Medicine

## 2019-05-14 DIAGNOSIS — Z1231 Encounter for screening mammogram for malignant neoplasm of breast: Secondary | ICD-10-CM

## 2019-05-26 ENCOUNTER — Ambulatory Visit: Payer: BC Managed Care – PPO

## 2019-07-03 ENCOUNTER — Ambulatory Visit: Payer: BC Managed Care – PPO
